# Patient Record
Sex: Female | Born: 1950
Health system: Southern US, Community
[De-identification: ages and names within clinical notes are randomized; demographics above are authoritative.]

## PROBLEM LIST (undated history)

## (undated) DIAGNOSIS — J45909 Unspecified asthma, uncomplicated: Secondary | ICD-10-CM

## (undated) DIAGNOSIS — R7303 Prediabetes: Secondary | ICD-10-CM

## (undated) DIAGNOSIS — E785 Hyperlipidemia, unspecified: Secondary | ICD-10-CM

## (undated) DIAGNOSIS — I10 Essential (primary) hypertension: Secondary | ICD-10-CM

## (undated) DIAGNOSIS — E119 Type 2 diabetes mellitus without complications: Secondary | ICD-10-CM

## (undated) HISTORY — DX: Hyperlipidemia, unspecified: E78.5

## (undated) HISTORY — DX: Type 2 diabetes mellitus without complications: E11.9

## (undated) HISTORY — PX: ABDOMINAL HYSTERECTOMY: SHX81

## (undated) HISTORY — DX: Prediabetes: R73.03

## (undated) HISTORY — DX: Essential (primary) hypertension: I10

---

## 1998-10-05 ENCOUNTER — Ambulatory Visit (HOSPITAL_COMMUNITY): Admission: RE | Admit: 1998-10-05 | Discharge: 1998-10-05 | Payer: Self-pay | Admitting: Gynecology

## 1998-10-05 ENCOUNTER — Encounter: Payer: Self-pay | Admitting: Gynecology

## 1998-10-08 ENCOUNTER — Encounter: Payer: Self-pay | Admitting: Gynecology

## 1998-10-08 ENCOUNTER — Ambulatory Visit: Admission: RE | Admit: 1998-10-08 | Discharge: 1998-10-08 | Payer: Self-pay | Admitting: Gynecology

## 1999-01-10 ENCOUNTER — Encounter: Admission: RE | Admit: 1999-01-10 | Discharge: 1999-01-10 | Payer: Self-pay | Admitting: Gynecology

## 1999-01-10 ENCOUNTER — Encounter: Payer: Self-pay | Admitting: Gynecology

## 1999-04-01 ENCOUNTER — Other Ambulatory Visit: Admission: RE | Admit: 1999-04-01 | Discharge: 1999-04-01 | Payer: Self-pay | Admitting: Gynecology

## 2000-01-16 ENCOUNTER — Encounter: Payer: Self-pay | Admitting: Gynecology

## 2000-01-16 ENCOUNTER — Encounter: Admission: RE | Admit: 2000-01-16 | Discharge: 2000-01-16 | Payer: Self-pay | Admitting: Gynecology

## 2000-02-03 ENCOUNTER — Other Ambulatory Visit: Admission: RE | Admit: 2000-02-03 | Discharge: 2000-02-03 | Payer: Self-pay | Admitting: Gynecology

## 2001-01-17 ENCOUNTER — Encounter: Admission: RE | Admit: 2001-01-17 | Discharge: 2001-01-17 | Payer: Self-pay | Admitting: Gynecology

## 2001-01-17 ENCOUNTER — Encounter: Payer: Self-pay | Admitting: Gynecology

## 2001-02-16 ENCOUNTER — Other Ambulatory Visit: Admission: RE | Admit: 2001-02-16 | Discharge: 2001-02-16 | Payer: Self-pay | Admitting: Gynecology

## 2002-01-23 ENCOUNTER — Encounter: Payer: Self-pay | Admitting: Gynecology

## 2002-01-23 ENCOUNTER — Encounter: Admission: RE | Admit: 2002-01-23 | Discharge: 2002-01-23 | Payer: Self-pay | Admitting: Gynecology

## 2002-04-20 ENCOUNTER — Other Ambulatory Visit: Admission: RE | Admit: 2002-04-20 | Discharge: 2002-04-20 | Payer: Self-pay | Admitting: Gynecology

## 2003-03-12 ENCOUNTER — Encounter: Admission: RE | Admit: 2003-03-12 | Discharge: 2003-03-12 | Payer: Self-pay | Admitting: Gynecology

## 2003-06-21 ENCOUNTER — Other Ambulatory Visit: Admission: RE | Admit: 2003-06-21 | Discharge: 2003-06-21 | Payer: Self-pay | Admitting: Gynecology

## 2004-03-23 ENCOUNTER — Emergency Department (HOSPITAL_COMMUNITY): Admission: EM | Admit: 2004-03-23 | Discharge: 2004-03-23 | Payer: Self-pay | Admitting: Family Medicine

## 2004-07-02 ENCOUNTER — Encounter: Admission: RE | Admit: 2004-07-02 | Discharge: 2004-07-02 | Payer: Self-pay | Admitting: Gynecology

## 2004-07-08 ENCOUNTER — Other Ambulatory Visit: Admission: RE | Admit: 2004-07-08 | Discharge: 2004-07-08 | Payer: Self-pay | Admitting: Gynecology

## 2005-01-25 ENCOUNTER — Emergency Department (HOSPITAL_COMMUNITY): Admission: EM | Admit: 2005-01-25 | Discharge: 2005-01-25 | Payer: Self-pay | Admitting: Family Medicine

## 2005-07-23 ENCOUNTER — Encounter: Admission: RE | Admit: 2005-07-23 | Discharge: 2005-07-23 | Payer: Self-pay | Admitting: Gynecology

## 2005-09-16 ENCOUNTER — Other Ambulatory Visit: Admission: RE | Admit: 2005-09-16 | Discharge: 2005-09-16 | Payer: Self-pay | Admitting: Gynecology

## 2006-08-02 ENCOUNTER — Encounter: Admission: RE | Admit: 2006-08-02 | Discharge: 2006-08-02 | Payer: Self-pay | Admitting: Gynecology

## 2006-08-04 ENCOUNTER — Encounter: Admission: RE | Admit: 2006-08-04 | Discharge: 2006-08-04 | Payer: Self-pay | Admitting: Gynecology

## 2006-10-04 ENCOUNTER — Other Ambulatory Visit: Admission: RE | Admit: 2006-10-04 | Discharge: 2006-10-04 | Payer: Self-pay | Admitting: Gynecology

## 2007-10-04 ENCOUNTER — Encounter: Admission: RE | Admit: 2007-10-04 | Discharge: 2007-10-04 | Payer: Self-pay | Admitting: Gynecology

## 2008-06-21 ENCOUNTER — Emergency Department (HOSPITAL_COMMUNITY): Admission: EM | Admit: 2008-06-21 | Discharge: 2008-06-21 | Payer: Self-pay | Admitting: Emergency Medicine

## 2008-08-29 ENCOUNTER — Emergency Department (HOSPITAL_COMMUNITY): Admission: EM | Admit: 2008-08-29 | Discharge: 2008-08-29 | Payer: Self-pay | Admitting: Family Medicine

## 2008-10-17 ENCOUNTER — Encounter: Admission: RE | Admit: 2008-10-17 | Discharge: 2008-10-17 | Payer: Self-pay | Admitting: Family Medicine

## 2009-05-27 ENCOUNTER — Emergency Department (HOSPITAL_COMMUNITY): Admission: EM | Admit: 2009-05-27 | Discharge: 2009-05-27 | Payer: Self-pay | Admitting: Emergency Medicine

## 2009-11-13 ENCOUNTER — Encounter: Admission: RE | Admit: 2009-11-13 | Discharge: 2009-11-13 | Payer: Self-pay | Admitting: Family Medicine

## 2010-02-09 ENCOUNTER — Encounter: Payer: Self-pay | Admitting: Gynecology

## 2010-11-10 ENCOUNTER — Other Ambulatory Visit: Payer: Self-pay | Admitting: Gynecology

## 2010-11-10 DIAGNOSIS — Z1231 Encounter for screening mammogram for malignant neoplasm of breast: Secondary | ICD-10-CM

## 2010-11-24 ENCOUNTER — Ambulatory Visit
Admission: RE | Admit: 2010-11-24 | Discharge: 2010-11-24 | Disposition: A | Discharge status: Home / Self Care | Payer: 59 | Source: Ambulatory Visit | Attending: Gynecology | Admitting: Gynecology

## 2010-11-24 ENCOUNTER — Ambulatory Visit: Payer: Self-pay

## 2010-11-24 DIAGNOSIS — Z1231 Encounter for screening mammogram for malignant neoplasm of breast: Secondary | ICD-10-CM

## 2010-12-01 ENCOUNTER — Other Ambulatory Visit: Payer: Self-pay | Admitting: Gynecology

## 2010-12-16 ENCOUNTER — Ambulatory Visit: Payer: Self-pay

## 2011-10-19 ENCOUNTER — Ambulatory Visit: Payer: 59 | Attending: Family Medicine

## 2011-10-19 DIAGNOSIS — R5381 Other malaise: Secondary | ICD-10-CM | POA: Insufficient documentation

## 2011-10-19 DIAGNOSIS — M25519 Pain in unspecified shoulder: Secondary | ICD-10-CM | POA: Insufficient documentation

## 2011-10-19 DIAGNOSIS — M25619 Stiffness of unspecified shoulder, not elsewhere classified: Secondary | ICD-10-CM | POA: Insufficient documentation

## 2011-10-19 DIAGNOSIS — IMO0001 Reserved for inherently not codable concepts without codable children: Secondary | ICD-10-CM | POA: Insufficient documentation

## 2011-10-21 ENCOUNTER — Ambulatory Visit: Payer: 59 | Attending: Family Medicine

## 2011-10-21 DIAGNOSIS — IMO0001 Reserved for inherently not codable concepts without codable children: Secondary | ICD-10-CM | POA: Insufficient documentation

## 2011-10-21 DIAGNOSIS — M25519 Pain in unspecified shoulder: Secondary | ICD-10-CM | POA: Insufficient documentation

## 2011-10-21 DIAGNOSIS — R5381 Other malaise: Secondary | ICD-10-CM | POA: Insufficient documentation

## 2011-10-21 DIAGNOSIS — M25619 Stiffness of unspecified shoulder, not elsewhere classified: Secondary | ICD-10-CM | POA: Insufficient documentation

## 2011-10-26 ENCOUNTER — Other Ambulatory Visit: Payer: Self-pay | Admitting: Gynecology

## 2011-10-26 DIAGNOSIS — Z1231 Encounter for screening mammogram for malignant neoplasm of breast: Secondary | ICD-10-CM

## 2011-10-28 ENCOUNTER — Ambulatory Visit: Payer: 59

## 2011-10-29 ENCOUNTER — Ambulatory Visit: Payer: 59

## 2011-11-05 ENCOUNTER — Ambulatory Visit: Payer: 59

## 2011-11-06 ENCOUNTER — Ambulatory Visit: Payer: 59

## 2011-11-10 ENCOUNTER — Ambulatory Visit: Payer: 59 | Admitting: Physical Therapy

## 2011-11-12 ENCOUNTER — Ambulatory Visit: Payer: 59 | Admitting: Physical Therapy

## 2011-11-25 ENCOUNTER — Ambulatory Visit
Admission: RE | Admit: 2011-11-25 | Discharge: 2011-11-25 | Disposition: A | Discharge status: Home / Self Care | Payer: 59 | Source: Ambulatory Visit | Attending: Gynecology | Admitting: Gynecology

## 2011-11-25 DIAGNOSIS — Z1231 Encounter for screening mammogram for malignant neoplasm of breast: Secondary | ICD-10-CM

## 2011-12-21 ENCOUNTER — Other Ambulatory Visit: Payer: Self-pay | Admitting: Gynecology

## 2012-02-24 ENCOUNTER — Emergency Department (HOSPITAL_COMMUNITY)
Admission: EM | Admit: 2012-02-24 | Discharge: 2012-02-24 | Disposition: A | Discharge status: Home / Self Care | Payer: 59 | Source: Home / Self Care | Attending: Emergency Medicine | Admitting: Emergency Medicine

## 2012-02-24 ENCOUNTER — Encounter (HOSPITAL_COMMUNITY): Payer: Self-pay | Admitting: Emergency Medicine

## 2012-02-24 DIAGNOSIS — J45909 Unspecified asthma, uncomplicated: Secondary | ICD-10-CM

## 2012-02-24 HISTORY — DX: Unspecified asthma, uncomplicated: J45.909

## 2012-02-24 MED ORDER — PREDNISONE 20 MG PO TABS: 40.0000 mg | ORAL_TABLET | Freq: Every day | ORAL | Status: AC

## 2012-02-24 MED ORDER — ALBUTEROL SULFATE HFA 108 (90 BASE) MCG/ACT IN AERS: 1.0000 | INHALATION_SPRAY | Freq: Four times a day (QID) | RESPIRATORY_TRACT | Status: DC | PRN

## 2012-02-24 MED ORDER — FEXOFENADINE-PSEUDOEPHED ER 60-120 MG PO TB12: 1.0000 | ORAL_TABLET | Freq: Two times a day (BID) | ORAL | Status: DC

## 2012-02-24 NOTE — ED Notes (Signed)
Pt c/o cold sx x6 days Sx include: cough w/yellow phlegm, wheezing, body aches Denies: SOB, f/v/n/d Took her albuterol today around 0600  She is alert w/no signs of acute resp distress.

## 2012-02-24 NOTE — ED Provider Notes (Signed)
History     CSN: [id number]  Arrival date & time 02/24/12  1018   First MD Initiated Contact with Patient 02/24/12 1121      Chief Complaint  Patient presents with  . URI    (Consider location/radiation/quality/duration/timing/severity/associated sxs/prior treatment) HPI Comments: Patient presents urgent care describing for about 5-6 days, patient has been using albuterol and she's been wheezing frequently mostly at night. She has been coughing some yellowish looking phlegm and having body aches and having some mild fatigue or tiredness. Patient denies feeling short of breath, have not had any tactile fevers that she can tell. No vomiting nausea diarrhea as her abdominal pain. Patient usually does not have to use her albuterol and have been using that for the last few days.  Patient is a 62 y.o. female presenting with URI. The history is provided by the patient.  URI The primary symptoms include cough and wheezing. Primary symptoms do not include fever, fatigue, headaches, ear pain, sore throat, swollen glands, abdominal pain, nausea, arthralgias or rash. The current episode started 6 to 7 days ago. This is a new problem. The problem has not changed since onset. The illness is not associated with chills, plugged ear sensation or rhinorrhea.    Past Medical History  Diagnosis Date  . Asthma     Past Surgical History  Procedure Date  . Abdominal hysterectomy     No family history on file.  History  Substance Use Topics  . Smoking status: Never Smoker   . Smokeless tobacco: Not on file  . Alcohol Use: Yes    OB History    Grav Para Term Preterm Abortions TAB SAB Ect Mult Living                  Review of Systems  Constitutional: Negative for fever, chills, diaphoresis, activity change, appetite change and fatigue.  HENT: Negative for ear pain, sore throat and rhinorrhea.   Respiratory: Positive for cough and wheezing.   Gastrointestinal: Negative for nausea and  abdominal pain.  Musculoskeletal: Negative for arthralgias.  Skin: Negative for rash and wound.  Neurological: Negative for dizziness and headaches.    Allergies  Review of patient's allergies indicates no known allergies.  Home Medications   Current Outpatient Rx  Name  Route  Sig  Dispense  Refill  . ALBUTEROL SULFATE HFA 108 (90 BASE) MCG/ACT IN AERS   Inhalation   Inhale 2 puffs into the lungs every 6 (six) hours as needed.         . ALPRAZOLAM 0.5 MG PO TABS   Oral   Take 0.5 mg by mouth at bedtime as needed.         Marland Kitchen VIVELLE-DOT TD   Transdermal   Place onto the skin.         . IBUPROFEN PO   Oral   Take by mouth.         . SOLIFENACIN SUCCINATE 5 MG PO TABS   Oral   Take 10 mg by mouth daily.         . ALBUTEROL SULFATE HFA 108 (90 BASE) MCG/ACT IN AERS   Inhalation   Inhale 1-2 puffs into the lungs every 6 (six) hours as needed for wheezing.   1 Inhaler   0   . FEXOFENADINE-PSEUDOEPHED ER 60-120 MG PO TB12   Oral   Take 1 tablet by mouth every 12 (twelve) hours.   14 tablet   0   . PREDNISONE 20  MG PO TABS   Oral   Take 2 tablets (40 mg total) by mouth daily. 2 tablets daily for 5 days   10 tablet   0     BP 153/88  Pulse 83  Temp 99.6 F (37.6 C) (Oral)  Resp 20  Ht 5\' 2"  (1.575 m)  Wt 185 lb (83.915 kg)  BMI 33.84 kg/m2  SpO2 96%  Physical Exam  Nursing note and vitals reviewed. Constitutional: She is oriented to person, place, and time. Vital signs are normal. She appears well-developed and well-nourished.  Non-toxic appearance. She does not have a sickly appearance. She does not appear ill. No distress.  HENT:  Head: Normocephalic.  Right Ear: Tympanic membrane normal.  Left Ear: Tympanic membrane normal.  Mouth/Throat: Uvula is midline and mucous membranes are normal. No oropharyngeal exudate, posterior oropharyngeal edema, posterior oropharyngeal erythema or tonsillar abscesses.  Eyes: Conjunctivae normal are normal.  Right eye exhibits no discharge. Left eye exhibits no discharge. No scleral icterus.  Neck: Neck supple. No JVD present. No tracheal deviation present. No thyromegaly present.  Cardiovascular: Normal rate, regular rhythm, normal heart sounds and normal pulses.   Pulmonary/Chest: Effort normal and breath sounds normal. She has no decreased breath sounds.  Abdominal: Soft.  Neurological: She is alert and oriented to person, place, and time.  Skin: No rash noted. No erythema.    ED Course  Procedures (including critical care time)  Labs Reviewed - No data to display No results found.   1. Asthma       MDM  Symptoms consistent with most likely upper respiratory infection- induces some reactive airway disease. Patient has been instructed to take Allegra-D along with a short course of 5 days of prednisone. Also instructed to use albuterol every 6-8 hours in the next 48 hours. Patient understands and agrees with treatment plan and followup care as necessary. We have discussed what symptoms should warrant further evaluation her throat. Patient expressed understanding.        Jimmie Molly, MD 02/24/12 [phone]

## 2012-12-09 ENCOUNTER — Other Ambulatory Visit: Payer: Self-pay

## 2012-12-09 DIAGNOSIS — Z1231 Encounter for screening mammogram for malignant neoplasm of breast: Secondary | ICD-10-CM

## 2013-01-13 ENCOUNTER — Ambulatory Visit: Payer: 59

## 2013-01-16 ENCOUNTER — Ambulatory Visit
Admission: RE | Admit: 2013-01-16 | Discharge: 2013-01-16 | Disposition: A | Discharge status: Home / Self Care | Payer: 59 | Source: Ambulatory Visit

## 2013-01-16 DIAGNOSIS — Z1231 Encounter for screening mammogram for malignant neoplasm of breast: Secondary | ICD-10-CM

## 2013-07-07 ENCOUNTER — Emergency Department (HOSPITAL_COMMUNITY)
Admission: EM | Admit: 2013-07-07 | Discharge: 2013-07-07 | Disposition: A | Discharge status: Home / Self Care | Payer: 59 | Source: Home / Self Care | Attending: Family Medicine | Admitting: Family Medicine

## 2013-07-07 ENCOUNTER — Encounter (HOSPITAL_COMMUNITY): Payer: Self-pay | Admitting: Emergency Medicine

## 2013-07-07 DIAGNOSIS — J40 Bronchitis, not specified as acute or chronic: Secondary | ICD-10-CM

## 2013-07-07 MED ORDER — IPRATROPIUM-ALBUTEROL 0.5-2.5 (3) MG/3ML IN SOLN
RESPIRATORY_TRACT | Status: AC
  Filled 2013-07-07: qty 3

## 2013-07-07 MED ORDER — ALBUTEROL SULFATE HFA 108 (90 BASE) MCG/ACT IN AERS: 2.0000 | INHALATION_SPRAY | Freq: Four times a day (QID) | RESPIRATORY_TRACT | Status: AC | PRN

## 2013-07-07 MED ORDER — IPRATROPIUM BROMIDE 0.06 % NA SOLN: 2.0000 | Freq: Four times a day (QID) | NASAL | Status: DC

## 2013-07-07 MED ORDER — IPRATROPIUM-ALBUTEROL 0.5-2.5 (3) MG/3ML IN SOLN
3.0000 mL | Freq: Once | RESPIRATORY_TRACT | Status: AC
  Administered 2013-07-07: 3 mL via RESPIRATORY_TRACT

## 2013-07-07 MED ORDER — GUAIFENESIN-CODEINE 100-10 MG/5ML PO SOLN: 5.0000 mL | Freq: Every evening | ORAL | Status: DC | PRN

## 2013-07-07 MED ORDER — PREDNISONE 10 MG PO TABS: 30.0000 mg | ORAL_TABLET | Freq: Every day | ORAL | Status: DC

## 2013-07-07 NOTE — ED Notes (Signed)
C/o   Productive cough.  Sore throat.  Since Tuesday.  Denies fever, n/v/d.   Mild relief with otc meds.

## 2013-07-07 NOTE — Discharge Instructions (Signed)
Thank you for coming in today. Use the albuterol and codeine cough medicine as needed.  Take the prednisone daily for 5 days.  Use the nasal spray as well.  Call or go to the emergency room if you get worse, have trouble breathing, have chest pains, or palpitations.    Bronchitis Bronchitis is inflammation of the airways that extend from the windpipe into the lungs (bronchi). The inflammation often causes mucus to develop, which leads to a cough. If the inflammation becomes severe, it may cause shortness of breath. CAUSES  Bronchitis may be caused by:   Viral infections.   Bacteria.   Cigarette smoke.   Allergens, pollutants, and other irritants.  SIGNS AND SYMPTOMS  The most common symptom of bronchitis is a frequent cough that produces mucus. Other symptoms include:  Fever.   Body aches.   Chest congestion.   Chills.   Shortness of breath.   Sore throat.  DIAGNOSIS  Bronchitis is usually diagnosed through a medical history and physical exam. Tests, such as chest X-rays, are sometimes done to rule out other conditions.  TREATMENT  You may need to avoid contact with whatever caused the problem (smoking, for example). Medicines are sometimes needed. These may include:  Antibiotics. These may be prescribed if the condition is caused by bacteria.  Cough suppressants. These may be prescribed for relief of cough symptoms.   Inhaled medicines. These may be prescribed to help open your airways and make it easier for you to breathe.   Steroid medicines. These may be prescribed for those with recurrent (chronic) bronchitis. HOME CARE INSTRUCTIONS  Get plenty of rest.   Drink enough fluids to keep your urine clear or pale yellow (unless you have a medical condition that requires fluid restriction). Increasing fluids may help thin your secretions and will prevent dehydration.   Only take over-the-counter or prescription medicines as directed by your health care  provider.  Only take antibiotics as directed. Make sure you finish them even if you start to feel better.  Avoid secondhand smoke, irritating chemicals, and strong fumes. These will make bronchitis worse. If you are a smoker, quit smoking. Consider using nicotine gum or skin patches to help control withdrawal symptoms. Quitting smoking will help your lungs heal faster.   Put a cool-mist humidifier in your bedroom at night to moisten the air. This may help loosen mucus. Change the water in the humidifier daily. You can also run the hot water in your shower and sit in the bathroom with the door closed for 5-10 minutes.   Follow up with your health care provider as directed.   Wash your hands frequently to avoid catching bronchitis again or spreading an infection to others.  SEEK MEDICAL CARE IF: Your symptoms do not improve after 1 week of treatment.  SEEK IMMEDIATE MEDICAL CARE IF:  Your fever increases.  You have chills.   You have chest pain.   You have worsening shortness of breath.   You have bloody sputum.  You faint.  You have lightheadedness.  You have a severe headache.   You vomit repeatedly. MAKE SURE YOU:   Understand these instructions.  Will watch your condition.  Will get help right away if you are not doing well or get worse. Document Released: 01/05/2005 Document Revised: 10/26/2012 Document Reviewed: 08/30/2012 [facility] Patient Information 2015 [place], [place]. This information is not intended to replace advice given to you by your health care provider. Make sure you discuss any questions you have with  your health care provider.

## 2013-07-07 NOTE — ED Provider Notes (Signed)
Vanessa Dyer is a 63 y.o. female who presents to Urgent Care today for cough. Patient has had mild productive cough sore throat and congestion present for 3 days. No shortness of breath or wheezing. She additionally notes a runny nose. She has a history of asthma and states that her symptoms are not consistent with typical asthma exacerbations. She has not used albuterol inhaler yet. She has tried Mucinex which has been mildly effective. She denies any fevers or chills nausea vomiting or diarrhea. She denies any chest pains or trouble breathing as well.   Past Medical History  Diagnosis Date  . Asthma    History  Substance Use Topics  . Smoking status: Never Smoker   . Smokeless tobacco: Not on file  . Alcohol Use: Yes   ROS as above Medications: No current facility-administered medications for this encounter.   Current Outpatient Prescriptions  Medication Sig Dispense Refill  . ALPRAZolam (XANAX) 0.5 MG tablet Take 0.5 mg by mouth at bedtime as needed.      . IBUPROFEN PO Take by mouth.      . solifenacin (VESICARE) 5 MG tablet Take 10 mg by mouth daily.      Marland Kitchen albuterol (PROVENTIL HFA;VENTOLIN HFA) 108 (90 BASE) MCG/ACT inhaler Inhale 2 puffs into the lungs every 6 (six) hours as needed for wheezing or shortness of breath.  1 Inhaler  2  . Estradiol (VIVELLE-DOT TD) Place onto the skin.      . fexofenadine-pseudoephedrine (ALLEGRA-D) 60-120 MG per tablet Take 1 tablet by mouth every 12 (twelve) hours.  14 tablet  0  . guaiFENesin-codeine 100-10 MG/5ML syrup Take 5 mLs by mouth at bedtime as needed for cough.  120 mL  0  . ipratropium (ATROVENT) 0.06 % nasal spray Place 2 sprays into both nostrils 4 (four) times daily.  15 mL  1  . predniSONE (DELTASONE) 10 MG tablet Take 3 tablets (30 mg total) by mouth daily.  15 tablet  0    Exam:  BP 133/85  Pulse 85  Temp(Src) 98.6 F (37 C) (Oral)  Resp 16  SpO2 95% Gen: Well NAD HEENT: EOMI,  MMM posterior pharynx with cobblestoning.  Tympanic membranes are normal appearing bilaterally. Lungs: Normal work of breathing. CTABL Heart: RRR no MRG Abd: NABS, Soft. NT, ND Exts: Brisk capillary refill, warm and well perfused.   Patient was given a DuoNeb nebulizer treatment, and improved  No results found for this or any previous visit (from the past 24 hour(s)). No results found.  Assessment and Plan: 63 y.o. female with viral bronchitis with possible asthma component. Plan to treat with prednisone albuterol Atrovent nasal spray and codeine containing cough medication.  Discussed warning signs or symptoms. Please see discharge instructions. Patient expresses understanding.    Gregor Hams, MD 07/07/13 [phone]

## 2013-07-09 ENCOUNTER — Emergency Department (HOSPITAL_COMMUNITY): Payer: 59

## 2013-07-09 ENCOUNTER — Encounter (HOSPITAL_COMMUNITY): Payer: Self-pay | Admitting: Emergency Medicine

## 2013-07-09 ENCOUNTER — Emergency Department (HOSPITAL_COMMUNITY)
Admission: EM | Admit: 2013-07-09 | Discharge: 2013-07-09 | Disposition: A | Discharge status: Home / Self Care | Payer: 59 | Attending: Emergency Medicine | Admitting: Emergency Medicine

## 2013-07-09 DIAGNOSIS — J45909 Unspecified asthma, uncomplicated: Secondary | ICD-10-CM | POA: Insufficient documentation

## 2013-07-09 DIAGNOSIS — Z79899 Other long term (current) drug therapy: Secondary | ICD-10-CM | POA: Insufficient documentation

## 2013-07-09 DIAGNOSIS — J40 Bronchitis, not specified as acute or chronic: Secondary | ICD-10-CM

## 2013-07-09 DIAGNOSIS — IMO0002 Reserved for concepts with insufficient information to code with codable children: Secondary | ICD-10-CM | POA: Insufficient documentation

## 2013-07-09 MED ORDER — HYDROCOD POLST-CHLORPHEN POLST 10-8 MG/5ML PO LQCR: 5.0000 mL | Freq: Two times a day (BID) | ORAL | Status: AC

## 2013-07-09 NOTE — ED Notes (Signed)
MD at bedside. 

## 2013-07-09 NOTE — ED Notes (Signed)
Pt presents to ed with c/o cough, chest congestion, sore throat x 1 week.

## 2013-07-09 NOTE — ED Provider Notes (Signed)
CSN: [id number]     Arrival date & time 07/09/13  0827 History   First MD Initiated Contact with Patient 07/09/13 [phone]     Chief Complaint  Patient presents with  . Cough  . Sore Throat      HPI  Patient presents with cough and sore throat. Symptoms for 5 days. Patient is a 48 hours ago at urgent care. Normal x-ray. Discharge with prednisone. She is on Mucinex. She has albuterol inhaler at home. She continues to complain of congestion in her upper chest. She coughs but cannot cough anything up. Dry cough without sputum production. No fevers no chills. No chest pain.  Past Medical History  Diagnosis Date  . Asthma    Past Surgical History  Procedure Laterality Date  . Abdominal hysterectomy     No family history on file. History  Substance Use Topics  . Smoking status: Never Smoker   . Smokeless tobacco: Not on file  . Alcohol Use: Yes   OB History   Grav Para Term Preterm Abortions TAB SAB Ect Mult Living                 Review of Systems  Constitutional: Negative for fever, chills, diaphoresis, appetite change and fatigue.  HENT: Negative for mouth sores, sore throat and trouble swallowing.   Eyes: Negative for visual disturbance.  Respiratory: Positive for cough. Negative for chest tightness, shortness of breath and wheezing.   Cardiovascular: Negative for chest pain.  Gastrointestinal: Negative for nausea, vomiting, abdominal pain, diarrhea and abdominal distention.  Endocrine: Negative for polydipsia, polyphagia and polyuria.  Genitourinary: Negative for dysuria, frequency and hematuria.  Musculoskeletal: Negative for gait problem.  Skin: Negative for color change, pallor and rash.  Neurological: Negative for dizziness, syncope, light-headedness and headaches.  Hematological: Does not bruise/bleed easily.  Psychiatric/Behavioral: Negative for behavioral problems and confusion.      Allergies  Review of patient's allergies indicates no known allergies.  Home  Medications   Prior to Admission medications   Medication Sig Start Date End Date Taking? Authorizing Provider  albuterol (PROVENTIL HFA;VENTOLIN HFA) 108 (90 BASE) MCG/ACT inhaler Inhale 2 puffs into the lungs every 6 (six) hours as needed for wheezing or shortness of breath. 07/07/13  Yes Gregor Hams, MD  ALPRAZolam Duanne Moron) 0.5 MG tablet Take 0.5 mg by mouth at bedtime as needed.   Yes Historical Provider, MD  estradiol (VIVELLE-DOT) 0.05 MG/24HR patch Place 1 patch onto the skin 2 (two) times a week. Sundays and Thursdays   Yes Historical Provider, MD  guaiFENesin-codeine 100-10 MG/5ML syrup Take 5 mLs by mouth at bedtime as needed for cough. 07/07/13  Yes Gregor Hams, MD  ipratropium (ATROVENT) 0.06 % nasal spray Place 2 sprays into both nostrils 4 (four) times daily. 07/07/13  Yes Gregor Hams, MD  predniSONE (DELTASONE) 10 MG tablet Take 3 tablets (30 mg total) by mouth daily. 07/07/13  Yes Gregor Hams, MD  solifenacin (VESICARE) 5 MG tablet Take 10 mg by mouth daily.   Yes Historical Provider, MD  chlorpheniramine-HYDROcodone (TUSSIONEX PENNKINETIC ER) 10-8 MG/5ML LQCR Take 5 mLs by mouth every 12 (twelve) hours. 07/09/13   Tanna Furry, MD   BP 147/85  Pulse 103  Temp(Src) 98.4 F (36.9 C) (Oral)  Resp 18  SpO2 97% Physical Exam  Constitutional: She is oriented to person, place, and time. She appears well-developed and well-nourished. No distress.  HENT:  Head: Normocephalic.  Eyes: Conjunctivae are normal. Pupils are equal,  round, and reactive to light. No scleral icterus.  Neck: Normal range of motion. Neck supple. No thyromegaly present.  Cardiovascular: Normal rate and regular rhythm.  Exam reveals no gallop and no friction rub.   No murmur heard. Pulmonary/Chest: Effort normal and breath sounds normal. No respiratory distress. She has no wheezes. She has no rales.  Clear bilateral breath sounds. No focal diminished breath sounds. No prolongation or wheezing. No crackles or signs  of consolidation clinically. Not tachypneic.  Abdominal: Soft. Bowel sounds are normal. She exhibits no distension. There is no tenderness. There is no rebound.  Musculoskeletal: Normal range of motion.  Neurological: She is alert and oriented to person, place, and time.  Skin: Skin is warm and dry. No rash noted.  Psychiatric: She has a normal mood and affect. Her behavior is normal.    ED Course  Procedures (including critical care time) Labs Review Labs Reviewed - No data to display  Imaging Review No results found.   EKG Interpretation None      MDM   Final diagnoses:  Bronchitis    X-ray is normal. Discussion she's had symptoms now for 5 days. And this is still very likely viral bronchitis. Yesterday continue her Mucinex, when necessary albuterol, and finish her course of prednisone. Not wheezing or bronchospastic now. No indication for antibiotics. Offered Tussionex for her cough. Otherwise expectant management for her bronchitis, likely viral mediated.    Tanna Furry, MD 07/09/13 [phone]

## 2013-07-09 NOTE — Discharge Instructions (Signed)
Bronchitis  Bronchitis is swelling (inflammation) of the air tubes leading to your lungs (bronchi). This causes mucus and a cough. If the swelling gets bad, you may have trouble breathing.  HOME CARE   · Rest.  · Drink enough fluids to keep your pee (urine) clear or pale yellow (unless you have a condition where you have to watch how much you drink).  · Only take medicine as told by your doctor. If you were given antibiotic medicines, finish them even if you start to feel better.  · Avoid smoke, irritating chemicals, and strong smells. These make the problem worse. Quit smoking if you smoke. This helps your lungs heal faster.  · Use a cool mist humidifier. Change the water in the humidifier every day. You can also sit in the bathroom with hot shower running for 5-10 minutes. Keep the door closed.  · See your health care provider as told.  · Wash your hands often.  GET HELP IF:  Your problems do not get better after 1 week.  GET HELP RIGHT AWAY IF:   · Your fever gets worse.  · You have chills.  · Your chest hurts.  · Your problems breathing get worse.  · You have blood in your mucus.  · You pass out (faint).  · You feel light-headed.  · You have a bad headache.  · You throw up (vomit) again and again.  MAKE SURE YOU:  · Understand these instructions.  · Will watch your condition.  · Will get help right away if you are not doing well or get worse.  Document Released: 06/24/2007 Document Revised: 01/10/2013 Document Reviewed: 08/30/2012  [employer]® Patient Information ©2015 [employer]. This information is not intended to replace advice given to you by your health care provider. Make sure you discuss any questions you have with your health care provider.

## 2013-12-26 ENCOUNTER — Other Ambulatory Visit: Payer: Self-pay

## 2013-12-26 DIAGNOSIS — Z1231 Encounter for screening mammogram for malignant neoplasm of breast: Secondary | ICD-10-CM

## 2014-01-17 ENCOUNTER — Ambulatory Visit: Payer: 59

## 2014-12-06 ENCOUNTER — Telehealth: Payer: Self-pay

## 2015-02-04 ENCOUNTER — Ambulatory Visit: Payer: 59 | Admitting: *Deleted

## 2015-02-07 ENCOUNTER — Other Ambulatory Visit: Payer: Self-pay

## 2015-02-07 VITALS — BP 140/88 | HR 70 | Resp 16 | Ht 62.0 in | Wt 188.6 lb

## 2015-02-07 DIAGNOSIS — I10 Essential (primary) hypertension: Secondary | ICD-10-CM

## 2015-02-07 NOTE — Patient Instructions (Signed)
1. Plan to attend required meeting with RD at the [facility] [place].  They will call you to schedule or call them at [phone] 2. Plan to pick up blood pressure monitor at the outpatient pharmacy 3. Plan to check blood pressure daily at different time and record on log 4. Plan to eat 30-45 GM (2-3) servings of carbohydrate a meal and 15 GM for snacks.  Plan to eat protein with your snacks 5. Plan to ride exercise bike for 10 minutes for 4 days a week.  Plan to increase time by 5 minutes as tolerated 6. Plan to complete EMMI programs by 04/01/15 7. Plan to return to [facility] in 2 months Call to Schedule

## 2015-02-07 NOTE — Patient Outreach (Signed)
[facility]) Care Management   02/07/2015  [place]RI BRUCKNER November 07, 1950 Vanessa Dyer  Vanessa Dyer is an 65 y.o. female. Member seen for initial office visit for Link to Wellness program for self management of hypertension   Subjective: Member staets that her blood pressures have been up and she would like to get it under control.  States she does not want to go on any medications.  States she is also prediabetic.  States she would like to lose weight.  States she has not been exercising any but she bought an exercise bike and wants to start.  States she does not add salt to her foods but she does like to eat CHO.  States she has an old blood pressure monitor but her provider wants her to get a new more reliable monitor.  States she has not gotten it yet.  Objective:   Review of Systems  All other systems reviewed and are negative.   Physical Exam  Today's Vitals   02/07/15 1320  BP: 140/88  Pulse: 70  Resp: 16  Height: 1.575 m (5\' 2" )  Weight: 188 lb 9.6 oz (85.548 kg)  SpO2: 98%  PainSc: 0-No pain    Current Medications:   Current Outpatient Prescriptions  Medication Sig Dispense Refill  . albuterol (PROVENTIL HFA;VENTOLIN HFA) 108 (90 BASE) MCG/ACT inhaler Inhale 2 puffs into the lungs every 6 (six) hours as needed for wheezing or shortness of breath. 1 Inhaler 2  . conjugated estrogens (PREMARIN) vaginal cream Place 1 Applicatorful vaginally 2 (two) times a week.    . estradiol (VIVELLE-DOT) 0.05 MG/24HR patch Place 1 patch onto the skin 2 (two) times a week. Sundays and Thursdays    . Multiple Vitamins-Minerals (MULTIVITAMIN GUMMIES ADULT PO) Take 1 tablet by mouth daily.    . solifenacin (VESICARE) 5 MG tablet Take 5 mg by mouth daily.     . valACYclovir (VALTREX) 500 MG tablet Take 500 mg by mouth 2 (two) times daily. Take 1/2 tablet twice a day for 5 days as needed    . Vitamin D, Cholecalciferol, 1000 units CAPS Take 1 capsule by mouth daily.    Marland Kitchen  ALPRAZolam (XANAX) 0.5 MG tablet Take 0.5 mg by mouth at bedtime as needed. Reported on 02/07/2015    . chlorpheniramine-HYDROcodone (TUSSIONEX PENNKINETIC ER) 10-8 MG/5ML LQCR Take 5 mLs by mouth every 12 (twelve) hours. (Patient not taking: Reported on 02/07/2015) 60 mL 0  . guaiFENesin-codeine 100-10 MG/5ML syrup Take 5 mLs by mouth at bedtime as needed for cough. (Patient not taking: Reported on 02/07/2015) 120 mL 0  . ipratropium (ATROVENT) 0.06 % nasal spray Place 2 sprays into both nostrils 4 (four) times daily. (Patient not taking: Reported on 02/07/2015) 15 mL 1  . predniSONE (DELTASONE) 10 MG tablet Take 3 tablets (30 mg total) by mouth daily. (Patient not taking: Reported on 02/07/2015) 15 tablet 0   No current facility-administered medications for this visit.    Functional Status:   In your present state of health, do you have any difficulty performing the following activities: 02/07/2015  Hearing? N  Vision? N  Difficulty concentrating or making decisions? N  Walking or climbing stairs? N  Dressing or bathing? N  Doing errands, shopping? N    Fall/Depression Screening:    PHQ 2/9 Scores 02/07/2015  PHQ - 2 Score 0    Assessment:  Member seen for initial office visit for Link to Wellness program for self management of Hypertension and impaired  fasting glucose.  Member has not been checking B/P at home as she had not purchased a new monitor yet.  Member does not do regular exercise but now has exercise equipment at home.  Reports following a low sodium diet but eating CHO.    Plan:  Plan to attend [facility]  They will call you to schedule or call them at [phone] Plan to pick up blood pressure monitor at the outpatient pharmacy Plan to check blood pressure daily at different time and record on log Plan to eat 30-45 GM (2-3) servings of carbohydrate a meal and 15 GM for snacks.  Plan to eat protein with your  snacks Plan to ride exercise bike for 10 minutes for 4 days a week.  Plan to increase time by 5 minutes as tolerated Plan to complete EMMI programs by 04/01/15 Plan to return to Link to Wellness in 2 months Call to Schedule  [facility] CM Care Plan Problem One        Most Recent Value   Care Plan Problem One  Elevated blood pressures   Role Documenting the Problem One  Care Management Coordinator   Care Plan for Problem One  Active   THN Long Term Goal (31-90 days)  Member will maintain blood pressure at 140/90 or below for 80 % of readings   THN Long Term Goal Start Date  02/07/15   Interventions for Problem One Long Term Goal  Given Link to Wellness teaching packet on hypertension and reviewed program requirements, Given voucher for low cost blood pressure monitor and instructed on how to pick up at pharmacy, Instructed on the dietary approaches to stopping  hypertendion diet, Instruced to check B/P dialy at different times  and to record on log, Instructed on importance of exercise  and weight loss to help control B/p    [facility] CM Care Plan Problem Two        Most Recent Value   Care Plan Problem Two  Knowledge deficit related to impaired fasting glocose   Role Documenting the Problem Two  Care Management Coordinator   Care Plan for Problem Two  Active   Interventions for Problem Two Long Term Goal   Given Link to Wellness diabetes teaching packet, Instructed on limiting portions sizes of CHO, Instructed on benefits of weight loss and exercise to improving her blood sugars   THN Long Term Goal (31-90) days  Member will verbalize understanding of prediabetes within the next 90 days   [facility] Long Term Goal Start Date  02/07/15    Peter Garter RN, [facility] Care Management Coordinator-Link to [place] Management [phone]

## 2015-02-08 DIAGNOSIS — R7301 Impaired fasting glucose: Secondary | ICD-10-CM | POA: Insufficient documentation

## 2015-02-22 MED FILL — MINIVELLE 0.05 MG PATCH: 0.05 | 28 days supply | Qty: 8 | Fill #5

## 2015-03-15 ENCOUNTER — Encounter: Payer: Self-pay | Admitting: Skilled Nursing Facility1

## 2015-03-15 ENCOUNTER — Encounter: Payer: 59 | Attending: Family Medicine | Admitting: Skilled Nursing Facility1

## 2015-03-15 VITALS — Ht 62.0 in | Wt 187.0 lb

## 2015-03-15 DIAGNOSIS — Z713 Dietary counseling and surveillance: Secondary | ICD-10-CM | POA: Insufficient documentation

## 2015-03-15 DIAGNOSIS — I10 Essential (primary) hypertension: Secondary | ICD-10-CM

## 2015-03-15 NOTE — Progress Notes (Signed)
  Medical Nutrition Therapy:  Appt start time: 0800 end time:  0845.   Assessment:  Primary concerns today: referred for hypertension. Pt states her doctor told her she has prediabetes or diabetes she does not really know, labs are able to be seen.  Pt states she sleeps well with 7 hours. Pt states her bowel movements are normal. Pt states she has pretty low energy.  Preferred Learning Style:   No preference indicated   Learning Readiness:   Contemplating  MEDICATIONS: See List   DIETARY INTAKE:  Usual eating pattern includes 3 meals and 2 snacks per day.  Everyday foods include none stated.  Avoided foods include none stated.    24-hr recall:  B ( AM):  mcdonalds-----omelet  Snk ( AM): cheese stick and crackers L ( PM): leftover omelet------salad----frozen meal---tuna fish Snk ( PM): peanut butter D ( PM): frozen meal with green beans Snk ( PM): none Beverages: coffee with cream and sugar, wine, sweet tea, water  *Meals outside the home: 2-3 times a week   Usual physical activity: ADL's  Estimated energy needs: 1600 calories 180 g carbohydrates 120 g protein 44 g fat  Progress Towards Goal(s):  In progress.   Nutritional Diagnosis:  NB-1.1 Food and nutrition-related knowledge deficit As related to no prior nutrition education from a nutrition professional.  As evidenced by pt report and 24 hr recall.    Intervention:  Nutrition counseling for hypertension. Dietitian educated the pt on balanced meals, meal skipping, and the importance of physical activity. Goals: -Try to be physically active often throughout the week; walking the stairs the at work  At least 3 days a week -Be aware of your grocery list: accommodate 3 meals and some snacks a day -A snack: carbohydrate OR vegetable AND Protein -A meal: carbohydrate, protein, vegetable -Drink more water: in your water you can put cucumber, cinnamon stick, frozen fruit, flavoring powders, etc.   Teaching Method  Utilized:  Visual Auditory  Barriers to learning/adherence to lifestyle change: none identified  Demonstrated degree of understanding via:  Teach Back   Monitoring/Evaluation:  Dietary intake, exercise, and body weight prn.

## 2015-03-15 NOTE — Patient Instructions (Signed)
-  Try to be physically active often throughout the week; walking the stairs the at work  At least 3 days a week -Be aware of your grocery list: accommodate 3 meals and some snacks a day -A snack: carbohydrate OR vegetable AND Protein -A meal: carbohydrate, protein, vegetable -Drink more water: in your water you can put cucumber, cinnamon stick, frozen fruit, flavoring powders, etc.

## 2015-03-26 MED FILL — VENTOLIN HFA 90 MCG INHALER: 108 (90 BAS | 90 days supply | Qty: 18 | Fill #0

## 2015-04-02 MED FILL — ALPRAZolam 0.5 MG TABS: 0.5 | 30 days supply | Qty: 30 | Fill #0

## 2015-04-10 DIAGNOSIS — R7301 Impaired fasting glucose: Secondary | ICD-10-CM | POA: Diagnosis not present

## 2015-04-15 ENCOUNTER — Ambulatory Visit: Payer: Self-pay

## 2015-04-16 MED FILL — CONTOUR NEXT STRIPS: 90 days supply | Qty: 100 | Fill #0

## 2015-04-16 MED FILL — MICROLET LANCETS: 90 days supply | Qty: 100 | Fill #0

## 2015-04-16 MED FILL — CONTOUR NEXT EZ METER: W/DEVICE | 20 days supply | Qty: 1 | Fill #0

## 2015-05-01 DIAGNOSIS — Z1389 Encounter for screening for other disorder: Secondary | ICD-10-CM | POA: Diagnosis not present

## 2015-05-01 DIAGNOSIS — Z6835 Body mass index (BMI) 35.0-35.9, adult: Secondary | ICD-10-CM | POA: Diagnosis not present

## 2015-05-01 DIAGNOSIS — Z130 Encounter for screening for diseases of the blood and blood-forming organs and certain disorders involving the immune mechanism: Secondary | ICD-10-CM | POA: Diagnosis not present

## 2015-05-01 DIAGNOSIS — Z1231 Encounter for screening mammogram for malignant neoplasm of breast: Secondary | ICD-10-CM | POA: Diagnosis not present

## 2015-05-01 DIAGNOSIS — Z01419 Encounter for gynecological examination (general) (routine) without abnormal findings: Secondary | ICD-10-CM | POA: Diagnosis not present

## 2015-05-01 DIAGNOSIS — N951 Menopausal and female climacteric states: Secondary | ICD-10-CM | POA: Diagnosis not present

## 2015-05-01 DIAGNOSIS — N3946 Mixed incontinence: Secondary | ICD-10-CM | POA: Diagnosis not present

## 2015-05-01 DIAGNOSIS — Z79890 Hormone replacement therapy (postmenopausal): Secondary | ICD-10-CM | POA: Diagnosis not present

## 2015-05-01 MED FILL — MINIVELLE 0.05 MG PATCH: 0.05 | 55 days supply | Qty: 16 | Fill #0

## 2015-05-03 ENCOUNTER — Encounter: Payer: Self-pay | Admitting: Skilled Nursing Facility1

## 2015-05-03 ENCOUNTER — Encounter: Payer: 59 | Attending: Family Medicine | Admitting: Skilled Nursing Facility1

## 2015-05-03 VITALS — Ht 62.0 in | Wt 185.0 lb

## 2015-05-03 DIAGNOSIS — Z713 Dietary counseling and surveillance: Secondary | ICD-10-CM | POA: Insufficient documentation

## 2015-05-03 DIAGNOSIS — E119 Type 2 diabetes mellitus without complications: Secondary | ICD-10-CM

## 2015-05-03 DIAGNOSIS — I10 Essential (primary) hypertension: Secondary | ICD-10-CM | POA: Diagnosis not present

## 2015-05-03 NOTE — Progress Notes (Signed)
  Medical Nutrition Therapy:  Appt start time: 0800 end time:  0845.   Assessment:  Primary concerns today: referred for hypertension.Pt states her A1C is at 7%. Pt states she Thinks about what she eats before she eats it now. Pt states she has been watching her portions stopped putting sugar in her coffee. Pt staets her fasting blood sugars have been around 120 (lowest 90 highest 130). Pt has lost 2 pounds since her last visit and states her blood pressure is within normal limits.  Preferred Learning Style:   No preference indicated   Learning Readiness:   Contemplating  MEDICATIONS: See List   DIETARY INTAKE:  Usual eating pattern includes 3 meals and 2 snacks per day.  Everyday foods include none stated.  Avoided foods include none stated.    24-hr recall:  B ( AM):  mcdonalds-----omelet----salmon patties Snk ( AM): cheese stick and crackers L ( PM): leftover omelet------salad----frozen meal---tuna fish Snk ( PM): peanut butter D ( PM): frozen meal with green beans Snk ( PM): none Beverages: coffee with cream, wine, sweet tea, water  *Meals outside the home: 2-3 times a week   Usual physical activity: walking at work and stairs at work  Estimated energy needs: 1600 calories 180 g carbohydrates 120 g protein 44 g fat  Progress Towards Goal(s):  In progress.   Nutritional Diagnosis:  NB-1.1 Food and nutrition-related knowledge deficit As related to no prior nutrition education from a nutrition professional.  As evidenced by pt report and 24 hr recall.    Intervention:  Nutrition counseling for diabetes. Dietitian educated the pt on carbohydrate counting and congradualted her on her changes. Goals: -Try to be physically active often throughout the week; walking the stairs the at work  At least 3 days a week -Be aware of your grocery list: accommodate 3 meals and some snacks a day -A snack: carbohydrate OR vegetable AND Protein -A meal: carbohydrate, protein,  vegetable -Drink more water: in your water you can put cucumber, cinnamon stick, frozen fruit, flavoring powders, etc.   Teaching Method Utilized:  Visual Auditory  Barriers to learning/adherence to lifestyle change: none identified  Demonstrated degree of understanding via:  Teach Back   Monitoring/Evaluation:  Dietary intake, A1C, exercise, and body weight prn.

## 2015-05-17 ENCOUNTER — Ambulatory Visit: Payer: Self-pay

## 2015-05-17 ENCOUNTER — Other Ambulatory Visit: Payer: Self-pay

## 2015-05-17 VITALS — BP 128/88 | HR 70 | Resp 16 | Ht 62.0 in | Wt 187.8 lb

## 2015-05-17 DIAGNOSIS — I10 Essential (primary) hypertension: Secondary | ICD-10-CM

## 2015-05-17 DIAGNOSIS — R7301 Impaired fasting glucose: Secondary | ICD-10-CM

## 2015-05-17 NOTE — Patient Outreach (Signed)
[facility]) Care Management   05/17/2015  Vanessa Dyer 1950-04-19 [id number]  Vanessa Dyer is an 65 y.o. female.   Member seen for follow up office visit for Link to Wellness program for self management of Type 2 diabetes and hypertension  Subjective: Member states that her hemoglobin A1C has gone up to 7%.  States she told her provider she would like to try to lower it by diet and exercise.  States she saw the RD and she has been watching her CHO portions and she is avoiding rice and pasta as much as she can.  States she is checking her blood sugars before breakfast daily and they range form 90-137.  States she is checking her blood pressures daily and they have been good.  States she is following a low sodium diet.  States she has not been exercising regularly but she is taking the stairs at work and counting her steps with her phone.  Objective:   Review of Systems  All other systems reviewed and are negative.   Physical Exam Today's Vitals   05/17/15 1007  BP: 128/88  Pulse: 70  Resp: 16  Height: 1.575 m (_0 )  Weight: 187 lb 12.8 oz (85.186 kg)  SpO2: 97%  PainSc: 0-No pain   Encounter Medications:   Outpatient Encounter Prescriptions as of 05/17/2015  Medication Sig Note  . albuterol (PROVENTIL HFA;VENTOLIN HFA) 108 (90 BASE) MCG/ACT inhaler Inhale 2 puffs into the lungs every 6 (six) hours as needed for wheezing or shortness of breath.   . ALPRAZolam (XANAX) 0.5 MG tablet Take 0.5 mg by mouth at bedtime as needed. Reported on 02/07/2015   . conjugated estrogens (PREMARIN) vaginal cream Place 1 Applicatorful vaginally 2 (two) times a week.   . estradiol (VIVELLE-DOT) 0.05 MG/24HR patch Place 1 patch onto the skin 2 (two) times a week. Sundays and Thursdays 07/09/2013: Check strength   . ipratropium (ATROVENT) 0.06 % nasal spray Place 2 sprays into both nostrils 4 (four) times daily.   . Multiple Vitamins-Minerals (MULTIVITAMIN GUMMIES ADULT PO) Take  1 tablet by mouth daily.   . valACYclovir (VALTREX) 500 MG tablet Take 500 mg by mouth 2 (two) times daily. Take 1/2 tablet twice a day for 5 days as needed   . Vitamin D, Cholecalciferol, 1000 units CAPS Take 2 capsules by mouth daily.    . chlorpheniramine-HYDROcodone (TUSSIONEX PENNKINETIC ER) 10-8 MG/5ML LQCR Take 5 mLs by mouth every 12 (twelve) hours. (Patient not taking: Reported on 02/07/2015)   . guaiFENesin-codeine 100-10 MG/5ML syrup Take 5 mLs by mouth at bedtime as needed for cough. (Patient not taking: Reported on 02/07/2015)   . predniSONE (DELTASONE) 10 MG tablet Take 3 tablets (30 mg total) by mouth daily. (Patient not taking: Reported on 02/07/2015)   . solifenacin (VESICARE) 5 MG tablet Take 5 mg by mouth daily. Reported on 05/17/2015    No facility-administered encounter medications on file as of 05/17/2015.    Functional Status:   In your present state of health, do you have any difficulty performing the following activities: 02/07/2015  Hearing? N  Vision? N  Difficulty concentrating or making decisions? N  Walking or climbing stairs? N  Dressing or bathing? N  Doing errands, shopping? N    Fall/Depression Screening:    PHQ 2/9 Scores 05/17/2015 05/03/2015 03/15/2015 02/07/2015  PHQ - 2 Score 0 0 0 0    Assessment:  Member seen for follow up office visit for Link to [employer]  program for self management of Hypertension and new dx of Type 2 DM.Member with hemoglobin A1C increased to 7 at 04/10/15 visit.  Member does not want to start medication as she wants to try to lower with lifestyle changes, Member checking B/P at home daily with only 2 reading over 140/90.   Member not doing regular exercise  Reports following a lower CHO,  low sodium diet.  Member did visit with RD for education on diet.  Plan:   Plan to check blood sugars daily fasting or 1  -2 hours after meals.  Goals 80-130 fasting and 140-180 after meals Plan to check blood pressure 2-3 times a week at different  time and record on log Plan to eat 30-45 GM (2-3) servings of carbohydrate a meal and 15 GM for snacks.  Plan to eat protein with your snacks Plan to walk 20-30 minutes for 4 days a week.  Goal of 150 minutes a week Plan to complete EMMI programs by 06/20/15  Plan to return to Link to Wellness on 07/10/15 at [place] Problem One        Most Recent Value   Care Plan Problem One  Elevated blood pressures   Role Documenting the Problem One  Care Management [place] for Problem One  Active   THN Long Term Goal (31-90 days)  Member will maintain blood pressure at 140/90 or below for 80 % of readings   THN Long Term Goal Start Date  02/07/15   [facility] Long Term Goal Met Date  05/17/15 [Achieved goal ]   Interventions for Problem One Long Term Goal  Reinforced instructions on the dietary approaches to stopping  hypertendion diet, Reviewed B/P log,  Reinforced to check B/P 2-3 times a week at different times  and to record on log, Reinforced on importance of exercise  and weight loss to help control B/p    [facility] CM Care Plan Problem Two        Most Recent Value   Care Plan Problem Two  Knowledge deficit related to new dx of Type 2 DM   Role Documenting the Problem Two  Care Management Coordinator   Care Plan for Problem Two  Active   Interventions for Problem Two Long Term Goal   Instructed on type 2 DM and how to lower her blood sugars and hemoglobin A1C, Instructed on blood sugar goals, Reinforced instructions to  limit portions sizes of CHO, Reinforced benefits of weight loss and exercise to improving her blood sugars   THN Long Term Goal (31-90) days  Member will verbalize understanding of diabetes and lower hemoglobin A1C to below 7% within the next 90 days   [facility]N Long Term Goal Start Date  05/17/15 [continued hemoglobin A1C 7]    Peter Garter RN, [facility] Care Management Coordinator-Link to [place] Management [phone]

## 2015-05-17 NOTE — Patient Instructions (Signed)
1. Plan to check blood sugars daily fasting or 1  -2 hours after meals.  Goals 80-130 fasting and 140-180 after meals 2. Plan to check blood pressure 2-3 times a week at different time and record on log 3. Plan to eat 30-45 GM (2-3) servings of carbohydrate a meal and 15 GM for snacks.  Plan to eat protein with your snacks 4. Plan to walk 20-30 minutes for 4 days a week.  Goal of 150 minutes a week 5. Plan to complete EMMI programs by 06/20/15 6. Plan to return to Link to Wellness on 07/10/15 at 9:30AM

## 2015-05-24 IMAGING — CR DG CHEST 2V
2 series · 2 of 2 positions shown · non-contrast
Comparison: None.

CLINICAL DATA: cough

EXAM:
CHEST  2 VIEW

[w chest pa]
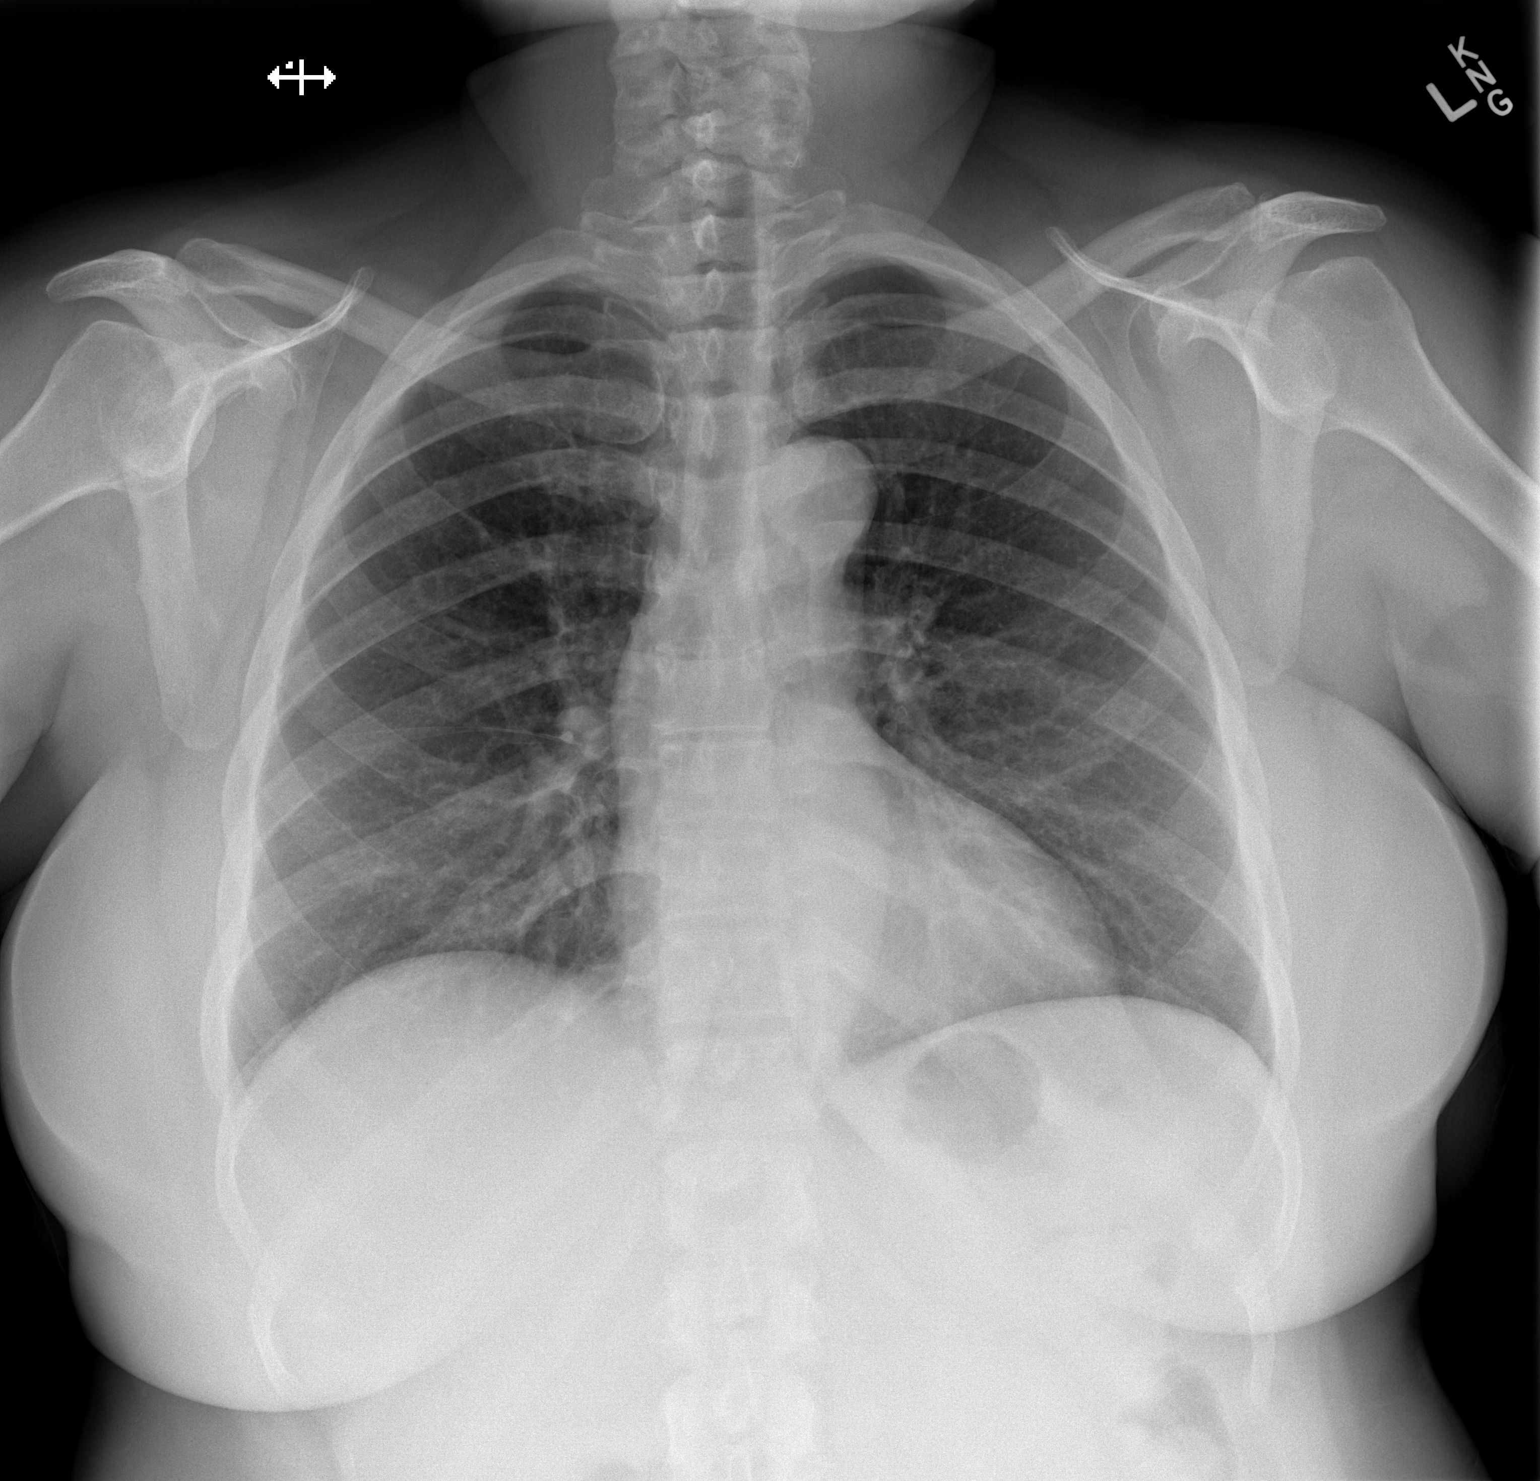

[w chest lat]
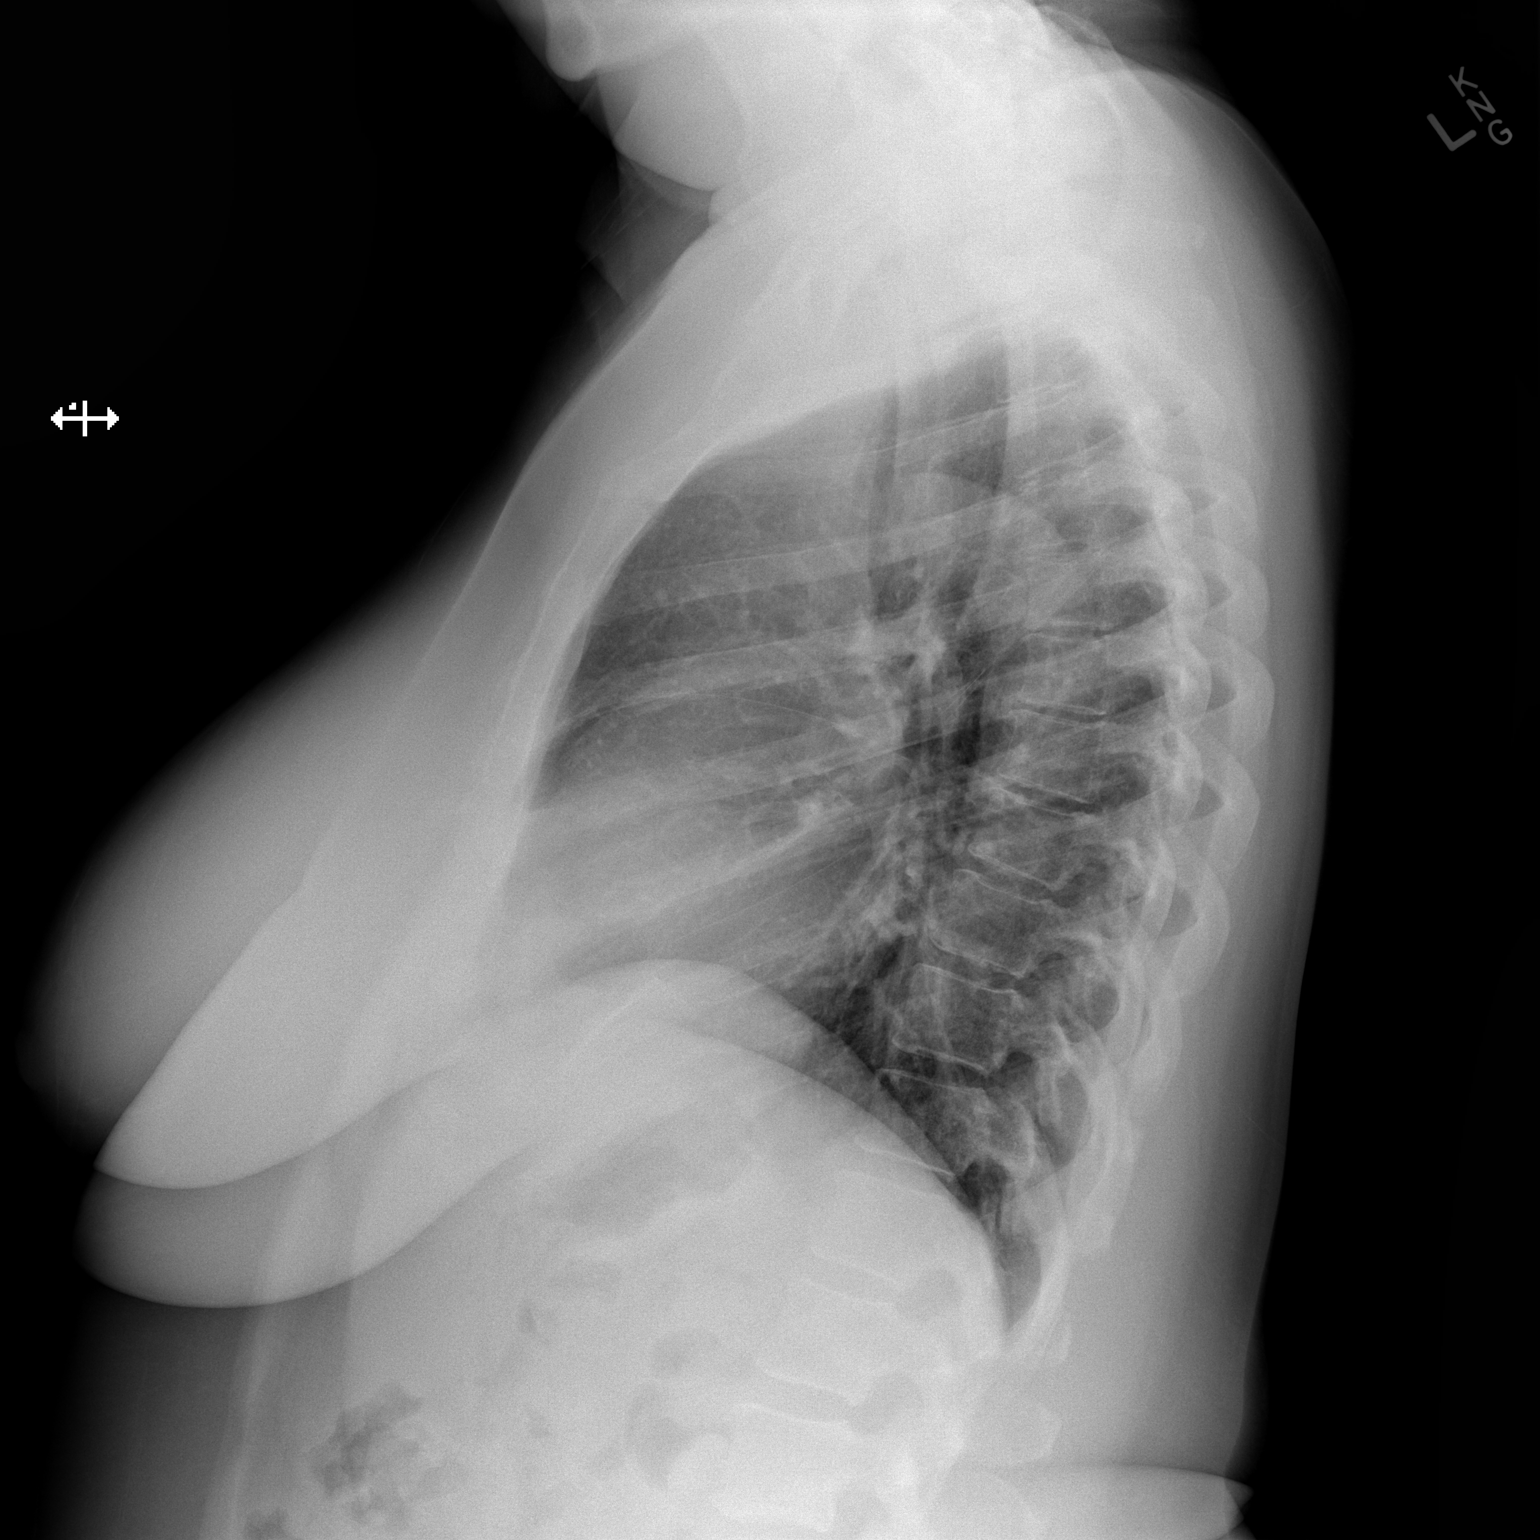

[2 of 2 positions shown; findings below may reference images not displayed]

FINDINGS: The heart size and mediastinal contours are within normal limits.
Both lungs are clear. The visualized skeletal structures are
unremarkable.
IMPRESSION: No active cardiopulmonary disease.

## 2015-06-26 MED FILL — PREMARIN VAGINAL CREAM-APPL: 0.625 | 90 days supply | Qty: 30 | Fill #1

## 2015-06-27 MED FILL — MINIVELLE 0.05 MG PATCH: 0.05 | 27 days supply | Qty: 8 | Fill #1

## 2015-07-10 ENCOUNTER — Ambulatory Visit: Payer: 59

## 2015-07-15 ENCOUNTER — Other Ambulatory Visit: Payer: Self-pay

## 2015-07-15 VITALS — BP 144/86 | HR 62 | Resp 16 | Ht 62.0 in | Wt 183.2 lb

## 2015-07-15 DIAGNOSIS — R7301 Impaired fasting glucose: Secondary | ICD-10-CM

## 2015-07-15 DIAGNOSIS — I10 Essential (primary) hypertension: Secondary | ICD-10-CM

## 2015-07-15 LAB — POCT GLYCOSYLATED HEMOGLOBIN (HGB A1C): Hemoglobin A1C: 6.5

## 2015-07-15 NOTE — Patient Outreach (Signed)
[facility]) Care Management   07/15/2015  Vanessa Dyer [birth year] [id number]  Vanessa Dyer is an 65 y.o. female.   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  States that her brother lives with her parents but she feels he is not doing everything that he could do to care for them.  States that she is watching her CHO portions.  States that she is walking at work and her goal on her [place] is 12,000 steps.  States that she is checking her blood sugars 3-4 times a week and they are ranging 92-130 before breakfast.  States her B/P readings have been good except they go up when she goes to visit her parents and her high readings are taken during this time.  States she is planning on taking some leave time to care for them and to get things in order at their house.  Objective:   Review of Systems  All other systems reviewed and are negative.   Physical Exam Today's Vitals   07/15/15 0959  BP: 144/86  Pulse: 62  Resp: 16  Height: 1.575 m (5\' 2" )  Weight: 183 lb 3.2 oz (83.099 kg)  SpO2: 99%  PainSc: 0-No pain   Encounter Medications:   Outpatient Encounter Prescriptions as of 07/15/2015  Medication Sig Note  . albuterol (PROVENTIL HFA;VENTOLIN HFA) 108 (90 BASE) MCG/ACT inhaler Inhale 2 puffs into the lungs every 6 (six) hours as needed for wheezing or shortness of breath.   . ALPRAZolam (XANAX) 0.5 MG tablet Take 0.5 mg by mouth at bedtime as needed. Reported on 02/07/2015   . conjugated estrogens (PREMARIN) vaginal cream Place 1 Applicatorful vaginally 2 (two) times a week.   . estradiol (VIVELLE-DOT) 0.05 MG/24HR patch Place 1 patch onto the skin 2 (two) times a week. Sundays and Thursdays 07/09/2013: Check strength   . Multiple Vitamins-Minerals (MULTIVITAMIN GUMMIES ADULT PO)  Take 1 tablet by mouth daily.   . valACYclovir (VALTREX) 500 MG tablet Take 500 mg by mouth 2 (two) times daily. Take 1/2 tablet twice a day for 5 days as needed   . Vitamin D, Cholecalciferol, 1000 units CAPS Take 2 capsules by mouth daily.    . chlorpheniramine-HYDROcodone (TUSSIONEX PENNKINETIC ER) 10-8 MG/5ML LQCR Take 5 mLs by mouth every 12 (twelve) hours. (Patient not taking: Reported on 02/07/2015)   . guaiFENesin-codeine 100-10 MG/5ML syrup Take 5 mLs by mouth at bedtime as needed for cough. (Patient not taking: Reported on 02/07/2015)   . ipratropium (ATROVENT) 0.06 % nasal spray Place 2 sprays into both nostrils 4 (four) times daily. (Patient not taking: Reported on 07/15/2015)   . predniSONE (DELTASONE) 10 MG tablet Take 3 tablets (30 mg total) by mouth daily. (Patient not taking: Reported on 02/07/2015)   . solifenacin (VESICARE) 5 MG tablet Take 5 mg by mouth daily. Reported on 07/15/2015    No facility-administered encounter medications on file as of 07/15/2015.    Functional Status:   In your present state of health, do you have any difficulty performing the following activities: 07/15/2015 02/07/2015  Hearing? N N  Vision? N N  Difficulty concentrating or making decisions? N N  Walking or climbing stairs? N N  Dressing or bathing? N N  Doing errands, shopping? N N    Fall/Depression Screening:  PHQ 2/9 Scores 07/15/2015 05/17/2015 05/03/2015 03/15/2015 02/07/2015  PHQ - 2 Score 0 0 0 0 0    Assessment:  Member seen for follow up office visit for Link to Wellness program for self management of Hypertension and new dx of Type 2 DM. Member with hemoglobin A1C of 6.5% at today's visit which is improved from 7%.   Member does not want to start medication as she wants to try to lower with lifestyle changes.   Member checking B/P at home 2-3 times a week highest reading 154/94 and lowest 108/74.  Reports increased stress with caregiving and family issues.  Member now walking regularly  for exercise and wearing an activity tracker. Reports following a lower CHO, low sodium diet.   Plan:   Plan to check blood sugars 1-2 times a week fasting or 1  -2 hours after meals.  Goals 80-110 fasting and 140-180 after meals Plan to check blood pressure 2-3 times a week at different time and record on log Plan to try doing relaxation exercises and consider going to yoga classes at [facility] to eat 30-45 GM (2-3) servings of carbohydrate a meal and 15 GM for snacks.  Plan to eat protein with your snacks Plan to walk 20-30 minutes for 4 days a week.  Goal of 150 minutes a week Plan to see provider on 10/01/15 Plan to return to Link to Wellness on 10/28/15 at [place] Problem One        Most Recent Value   Care Plan Problem One  Elevated blood pressures   Role Documenting the Problem One  Care Management Coordinator   Care Plan for Problem One  Active   THN Long Term Goal (31-90 days)  Member will maintain blood pressure at 140/90 or below for 80 % of readings   THN Long Term Goal Start Date  07/15/15 [Continue B/P elevated with stress]   Interventions for Problem One Long Term Goal  Discussed stress reduction methods and encouraged to use relaxation phone applications, Instructed  to discuss with her brother and sisters about their duties in caring for their parents,Reinforced instructions on the dietary approaches to stopping  hypertendion diet, Reviewed B/P log,  Reinforced to check B/P 2-3 times a week at different times  and to record on log, Reinforced on importance of exercise  and weight loss to help control B/p    [facility] CM Care Plan Problem Two        Most Recent Value   Care Plan Problem Two  Knowledge deficit related to new dx of Type 2 DM   Role Documenting the Problem Two  Care Management Coordinator   Care Plan for Problem Two  Active   Interventions for Problem Two Long Term Goal   Reinforced instructions on type 2 DM and how to lower her blood sugars  and hemoglobin A1C, Reviewed blood sugar goals and how they relate to her hemoglobin A1C, Praised for increasing activity and encouraged to continue to exercise, Reinforced instructions to  limit portions sizes of CHO, Reinforced benefits of weight loss and exercise to improving her blood sugars   THN Long Term Goal (31-90) days  Member will verbalize understanding of diabetes and lower hemoglobin A1C to below 7% within the next 90 days   THN Long Term Goal Start Date  07/15/15 [Continued hemoglobin A1C 6.5% today]    Peter Garter RN, [facility] Care Management Coordinator-Link to [place] Management [phone]

## 2015-07-15 NOTE — Patient Instructions (Signed)
1. Plan to check blood sugars 1-2 times a week fasting or 1  -2 hours after meals.  Goals 80-110 fasting and 140-180 after meals 2. Plan to check blood pressure 2-3 times a week at different time and record on log 3. Plan to try doing relaxation exercises and consider going to yoga classes at [facility] 4. Plan to eat 30-45 GM (2-3) servings of carbohydrate a meal and 15 GM for snacks.  Plan to eat protein with your snacks 5. Plan to walk 20-30 minutes for 4 days a week.  Goal of 150 minutes a week 6. Plan to see provider on 10/01/15 Plan to return to Link to Wellness on 10/28/15 at [facility]

## 2015-08-01 MED FILL — MICROLET LANCETS: 90 days supply | Qty: 100 | Fill #1

## 2015-08-23 MED FILL — MINIVELLE 0.05 MG PATCH: 0.05 | 27 days supply | Qty: 8 | Fill #2

## 2015-10-09 DIAGNOSIS — Z23 Encounter for immunization: Secondary | ICD-10-CM | POA: Diagnosis not present

## 2015-10-09 DIAGNOSIS — E119 Type 2 diabetes mellitus without complications: Secondary | ICD-10-CM | POA: Diagnosis not present

## 2015-10-09 DIAGNOSIS — Z0000 Encounter for general adult medical examination without abnormal findings: Secondary | ICD-10-CM | POA: Diagnosis not present

## 2015-10-09 DIAGNOSIS — J4520 Mild intermittent asthma, uncomplicated: Secondary | ICD-10-CM | POA: Diagnosis not present

## 2015-10-09 MED FILL — LISINOPRIL 5 MG TABLET: 5 | 90 days supply | Qty: 90 | Fill #0

## 2015-10-21 MED FILL — ATORVASTATIN 10 MG TABLET: 10 | 30 days supply | Qty: 30 | Fill #0

## 2015-10-21 MED FILL — RAMIPRIL 2.5 MG CAPSULE: 2.5 | 30 days supply | Qty: 30 | Fill #0

## 2015-10-22 MED FILL — MICROLET LANCETS: 90 days supply | Qty: 100 | Fill #2

## 2015-10-22 MED FILL — MINIVELLE 0.05 MG PATCH: 0.05 | 27 days supply | Qty: 8 | Fill #3

## 2015-10-28 ENCOUNTER — Ambulatory Visit: Payer: Self-pay

## 2015-11-14 DIAGNOSIS — Z0100 Encounter for examination of eyes and vision without abnormal findings: Secondary | ICD-10-CM | POA: Diagnosis not present

## 2015-11-26 MED FILL — RAMIPRIL 2.5 MG CAPSULE: 2.5 | 30 days supply | Qty: 30 | Fill #1

## 2015-11-28 ENCOUNTER — Other Ambulatory Visit: Payer: Self-pay

## 2015-11-28 DIAGNOSIS — E119 Type 2 diabetes mellitus without complications: Secondary | ICD-10-CM | POA: Insufficient documentation

## 2015-11-28 DIAGNOSIS — I10 Essential (primary) hypertension: Secondary | ICD-10-CM

## 2015-11-28 NOTE — Patient Outreach (Signed)
[facility]) Care Management   11/28/2015  Vanessa Dyer 04-Jan-1951 [id number]  Vanessa Dyer is an 65 y.o. female.   Member seen for follow up office visit for Link to Wellness program for self management of Type 2 diabetes and hypertension  Subjective: member states that her hemoglobin A1C was down to 6.3% when she saw her provider in September.  States that her cholesterol was up some and she started on Lipitor.  States she also started on lisinopril but it made her cough.  States her MD changed her to ramipril and she is tolerating the medication well.  States her blood pressures have improved since starting the medication.  States she is now going to a trainer twice a week for 30 minutes and she is getting over 10,000 steps most days at work along with walking on her breaks a work.  States she is watching what she eats and is still trying to lose weight.  States her stress has improved as she has arranged for someone to assist her Mother with bathing several times a week.    Objective:   Review of Systems  All other systems reviewed and are negative.   Physical Exam Today's Vitals   11/28/15 1015 11/28/15 1027  BP: 122/88   Pulse: 65   Resp: 16   SpO2: 97%   Weight: 177 lb (80.3 kg)   Height: 1.575 m (5\' 2" )   PainSc: 0-No pain 0-No pain   Encounter Medications:   Outpatient Encounter Prescriptions as of 11/28/2015  Medication Sig Note  . albuterol (PROVENTIL HFA;VENTOLIN HFA) 108 (90 BASE) MCG/ACT inhaler Inhale 2 puffs into the lungs every 6 (six) hours as needed for wheezing or shortness of breath.   . ALPRAZolam (XANAX) 0.5 MG tablet Take 0.5 mg by mouth at bedtime as needed. Reported on 02/07/2015   . atorvastatin (LIPITOR) 10 MG tablet Take 10 mg by mouth daily.   Marland Kitchen estradiol (VIVELLE-DOT) 0.05 MG/24HR patch Place 1 patch onto the skin 2 (two) times a week. Sundays and Thursdays 07/09/2013: Check strength   . Multiple Vitamins-Minerals (MULTIVITAMIN  GUMMIES ADULT PO) Take 1 tablet by mouth daily.   . ramipril (ALTACE) 2.5 MG capsule Take 2.5 mg by mouth daily.   . valACYclovir (VALTREX) 500 MG tablet Take 500 mg by mouth 2 (two) times daily. Take 1/2 tablet twice a day for 5 days as needed   . Vitamin D, Cholecalciferol, 1000 units CAPS Take 2 capsules by mouth daily.    . chlorpheniramine-HYDROcodone (TUSSIONEX PENNKINETIC ER) 10-8 MG/5ML LQCR Take 5 mLs by mouth every 12 (twelve) hours. (Patient not taking: Reported on 11/28/2015)   . conjugated estrogens (PREMARIN) vaginal cream Place 1 Applicatorful vaginally 2 (two) times a week.   Marland Kitchen guaiFENesin-codeine 100-10 MG/5ML syrup Take 5 mLs by mouth at bedtime as needed for cough. (Patient not taking: Reported on 11/28/2015)   . ipratropium (ATROVENT) 0.06 % nasal spray Place 2 sprays into both nostrils 4 (four) times daily. (Patient not taking: Reported on 11/28/2015)   . predniSONE (DELTASONE) 10 MG tablet Take 3 tablets (30 mg total) by mouth daily. (Patient not taking: Reported on 11/28/2015)   . solifenacin (VESICARE) 5 MG tablet Take 5 mg by mouth daily. Reported on 07/15/2015    No facility-administered encounter medications on file as of 11/28/2015.     Functional Status:   In your present state of health, do you have any difficulty performing the following activities: 11/28/2015 07/15/2015  Hearing? N N  Vision? N N  Difficulty concentrating or making decisions? N N  Walking or climbing stairs? N N  Dressing or bathing? N N  Doing errands, shopping? N N  Some recent data might be hidden    Fall/Depression Screening:    PHQ 2/9 Scores 11/28/2015 07/15/2015 05/17/2015 05/03/2015 03/15/2015 02/07/2015  PHQ - 2 Score 0 0 0 0 0 0    Assessment:  Member seen for follow up office visit for Link to Wellness program for self management of Hypertension and new dx of Type 2 DM. Member with last hemoglobin A1C of 6.3%.  Member has started medication for B/P and hyperlipidemia.  Member checking B/P  at home 2-3 times a week highest reading 151/87 and lowest 115/79.  Reports improved stress with care giving since family has hired help for her parents.   Member now walking regularly for exercise,wearing an activity tracker and is going to a trainer 2 times a week. Reports following a lower CHO, low sodium diet.  Member has lost 6 lbs since last visit. Member is up to date with annual eye exam and dental check ups  Plan:  Plan to check blood sugars daily fasting or 1  -2 hours after meals.  Goals 80-110 fasting and 140-180 after meals Plan to check blood pressure 2-3 times a week at different time and record on log Plan to eat 30-45 GM (2-3) servings of carbohydrate a meal and 15 GM for snacks.  Plan to eat protein with your snacks.  Plan to try eating a bedtime snack with protein and a carbohydrate Plan to go to personal trainer 2 times a week and walk 10-20 minutes for 4 days a week at work.  Goal of 150 minutes a week Plan to enrolled in the [place] program Plan to be followed in the [employer] program  [place] Problem One   Flowsheet Row Most Recent Value  Care Plan Problem One  Elevated blood pressures  Role Documenting the Problem One  Care Management Coordinator  Care Plan for Problem One  Active  THN Long Term Goal (31-90 days)  Member will maintain blood pressure at 140/90 or below for 80 % of readings  THN Long Term Goal Start Date  11/28/15 [Continue B/P imporved with medication]  Interventions for Problem One Long Term Goal  Reinforced to follow the dietary approaches to stopping  hypertention diet, Reviewed B/P log, Instructed on the use of ramipril,  Reinforced to check B/P 2-3 times a week at different times  and to record on log, Reinforced on importance of exercise  and weight loss to help control B/p    [facility] CM Care Plan Problem Two   Flowsheet Row Most Recent Value  Care Plan Problem Two  Knowledge deficit related to new dx of Type 2 DM  Role Documenting the  Problem Two  Care Management Coordinator  Care Plan for Problem Two  Active  Interventions for Problem Two Long Term Goal    Reviewed blood sugar goals and how they relate to her hemoglobin A1C, Praised for increasing activity and working with a Clinical research associate, Encouraged to continue to exercise, Reinforced instructions to  limit portions sizes of CHO, Instructed to try eating a bedtime snack with protein and 15 GM of CHO to see it it will help with increased fasting blood sugars, Reinforced benefits of weight loss and exercise to improving her blood sugars, Given handout on the [facility] program and instructed to enroll in  the program  [facility] Long Term Goal (31-90) days  Member will verbalize understanding of diabetes and lower hemoglobin A1C to below 7% within the next 90 days  THN Long Term Goal Start Date  11/28/15 [Continued last  hemoglobin A1C 6.3%]    Peter Garter RN, [facility] Care Management Coordinator-Link to [place] Management [phone]

## 2015-11-28 NOTE — Patient Instructions (Signed)
1. Plan to check blood sugars daily fasting or 1  -2 hours after meals.  Goals 80-110 fasting and 140-180 after meals 2. Plan to check blood pressure 2-3 times a week at different time and record on log 3. Plan to eat 30-45 GM (2-3) servings of carbohydrate a meal and 15 GM for snacks.  Plan to eat protein with your snacks.  Plan to try eating a bedtime snack with protein and a carbohydrate 4. Plan to go to personal trainer 2 times a week and walk 10-20 minutes for 4 days a week at work.  Goal of 150 minutes a week 5. Plan to enrolled in the [facility] program 6. Plan to be followed in the [facility] program

## 2015-12-03 MED FILL — ATORVASTATIN 10 MG TABLET: 10 | 30 days supply | Qty: 30 | Fill #1

## 2015-12-18 MED FILL — MINIVELLE 0.05 MG PATCH: 0.05 | 27 days supply | Qty: 8 | Fill #4

## 2015-12-27 MED FILL — RAMIPRIL 2.5 MG CAPSULE: 2.5 | 30 days supply | Qty: 30 | Fill #2

## 2016-01-01 MED FILL — ATORVASTATIN 10 MG TABLET: 10 | 30 days supply | Qty: 30 | Fill #2

## 2016-01-28 MED FILL — RAMIPRIL 2.5 MG CAPSULE: 2.5 | 90 days supply | Qty: 90 | Fill #3

## 2016-01-28 MED FILL — ATORVASTATIN 10 MG TABLET: 10 | 90 days supply | Qty: 90 | Fill #3

## 2016-01-29 DIAGNOSIS — Z5181 Encounter for therapeutic drug level monitoring: Secondary | ICD-10-CM | POA: Diagnosis not present

## 2016-01-29 DIAGNOSIS — E7800 Pure hypercholesterolemia, unspecified: Secondary | ICD-10-CM | POA: Diagnosis not present

## 2016-02-19 MED FILL — VALACYCLOVIR HCL 500 MG TAB: 500 | 30 days supply | Qty: 30 | Fill #0

## 2016-04-29 MED FILL — ATORVASTATIN 10 MG TABLET: 10 | 90 days supply | Qty: 90 | Fill #0

## 2016-04-30 DIAGNOSIS — E119 Type 2 diabetes mellitus without complications: Secondary | ICD-10-CM | POA: Diagnosis not present

## 2016-04-30 DIAGNOSIS — R7309 Other abnormal glucose: Secondary | ICD-10-CM | POA: Diagnosis not present

## 2016-04-30 DIAGNOSIS — R030 Elevated blood-pressure reading, without diagnosis of hypertension: Secondary | ICD-10-CM | POA: Diagnosis not present

## 2016-04-30 DIAGNOSIS — J4520 Mild intermittent asthma, uncomplicated: Secondary | ICD-10-CM | POA: Diagnosis not present

## 2016-04-30 MED FILL — ACCU-CHEK GUIDE TEST STRIP: 90 days supply | Qty: 100 | Fill #0

## 2016-04-30 MED FILL — VENTOLIN HFA 90 MCG INHALER: 108 (90 BAS | 16 days supply | Qty: 18 | Fill #0

## 2016-04-30 MED FILL — ACCU-CHEK FASTCLIX LANCETS: 90 days supply | Qty: 102 | Fill #0

## 2016-06-09 DIAGNOSIS — A6004 Herpesviral vulvovaginitis: Secondary | ICD-10-CM | POA: Diagnosis not present

## 2016-06-09 DIAGNOSIS — Z780 Asymptomatic menopausal state: Secondary | ICD-10-CM | POA: Diagnosis not present

## 2016-06-09 DIAGNOSIS — Z130 Encounter for screening for diseases of the blood and blood-forming organs and certain disorders involving the immune mechanism: Secondary | ICD-10-CM | POA: Diagnosis not present

## 2016-06-09 DIAGNOSIS — Z01419 Encounter for gynecological examination (general) (routine) without abnormal findings: Secondary | ICD-10-CM | POA: Diagnosis not present

## 2016-06-09 DIAGNOSIS — Z1389 Encounter for screening for other disorder: Secondary | ICD-10-CM | POA: Diagnosis not present

## 2016-06-09 DIAGNOSIS — Z6831 Body mass index (BMI) 31.0-31.9, adult: Secondary | ICD-10-CM | POA: Diagnosis not present

## 2016-06-09 MED FILL — valACYclovir HCL 1 GM TABS: 1 | 30 days supply | Qty: 30 | Fill #0

## 2016-06-10 ENCOUNTER — Other Ambulatory Visit: Payer: Self-pay | Admitting: Family Medicine

## 2016-06-10 MED ORDER — LEVOCETIRIZINE DIHYDROCHLORIDE 5 MG PO TABS: 5.0000 mg | ORAL_TABLET | Freq: Every evening | ORAL | 0 refills | Status: DC

## 2016-06-10 MED ORDER — BENZONATATE 100 MG PO CAPS: 100.0000 mg | ORAL_CAPSULE | Freq: Three times a day (TID) | ORAL | 0 refills | Status: DC | PRN

## 2016-06-10 MED FILL — LEVOCETIRIZINE 5 MG TABLET: 5 | 90 days supply | Qty: 90 | Fill #0

## 2016-06-10 MED FILL — BENZONATATE 100 MG CAP: 100 | 6 days supply | Qty: 40 | Fill #0

## 2016-06-10 NOTE — Progress Notes (Signed)
E-prescribed ben zonate and Xyzal 5 mg once daily for seasonal allergy symptoms.

## 2016-07-27 MED FILL — ATORVASTATIN 10 MG TABLET: 10 | 90 days supply | Qty: 90 | Fill #1

## 2016-08-08 MED FILL — ACCU-CHEK FASTCLIX LANCETS: 90 days supply | Qty: 102 | Fill #1

## 2016-08-10 MED FILL — ACCU-CHEK GUIDE TEST STRIP: 90 days supply | Qty: 100 | Fill #1

## 2016-10-27 DIAGNOSIS — M65312 Trigger thumb, left thumb: Secondary | ICD-10-CM | POA: Diagnosis not present

## 2016-10-28 MED FILL — MELOXICAM 15 MG TABLET: 15 | 15 days supply | Qty: 15 | Fill #0

## 2016-11-02 MED FILL — ATORVASTATIN 10 MG TABLET: 10 | 60 days supply | Qty: 60 | Fill #2

## 2016-11-02 MED FILL — ACCU-CHEK FASTCLIX LANCETS: 90 days supply | Qty: 102 | Fill #2

## 2016-11-02 MED FILL — ACCU-CHEK GUIDE TEST STRIP: 90 days supply | Qty: 100 | Fill #2

## 2016-12-24 DIAGNOSIS — L989 Disorder of the skin and subcutaneous tissue, unspecified: Secondary | ICD-10-CM | POA: Diagnosis not present

## 2016-12-24 DIAGNOSIS — I10 Essential (primary) hypertension: Secondary | ICD-10-CM | POA: Diagnosis not present

## 2016-12-24 DIAGNOSIS — E7800 Pure hypercholesterolemia, unspecified: Secondary | ICD-10-CM | POA: Diagnosis not present

## 2016-12-24 DIAGNOSIS — Z0000 Encounter for general adult medical examination without abnormal findings: Secondary | ICD-10-CM | POA: Diagnosis not present

## 2016-12-24 DIAGNOSIS — E119 Type 2 diabetes mellitus without complications: Secondary | ICD-10-CM | POA: Diagnosis not present

## 2016-12-24 MED FILL — ATORVASTATIN 10 MG TABLET: 10 | 90 days supply | Qty: 90 | Fill #0

## 2016-12-24 MED FILL — LOSARTAN POTASSIUM 50 MG TA: 50 | 30 days supply | Qty: 30 | Fill #0

## 2016-12-31 ENCOUNTER — Other Ambulatory Visit: Payer: Self-pay

## 2016-12-31 NOTE — Patient Outreach (Signed)
[facility] [facility]) Care Management  12/31/2016  Vanessa Dyer Jan 14, 1951 [id number]   Case closed in [place].  Member is being followed by the [employer] program Member enrolled in an external program. Peter Garter RN, [facility] Care Management Coordinator-Link to [place] Management [phone]

## 2017-01-06 DIAGNOSIS — M65312 Trigger thumb, left thumb: Secondary | ICD-10-CM | POA: Diagnosis not present

## 2017-01-23 MED FILL — LOSARTAN POTASSIUM 50 MG TA: 50 | 30 days supply | Qty: 30 | Fill #1

## 2017-01-23 MED FILL — ACCU-CHEK FASTCLIX LANCETS: 90 days supply | Qty: 102 | Fill #3

## 2017-01-25 MED FILL — ACCU-CHEK GUIDE TEST STRIP: 90 days supply | Qty: 100 | Fill #3

## 2017-02-23 MED FILL — LOSARTAN POTASSIUM 50 MG TA: 50 | 90 days supply | Qty: 90 | Fill #0

## 2017-03-31 MED FILL — ATORVASTATIN 10 MG TABLET: 10 | 90 days supply | Qty: 90 | Fill #1

## 2017-04-13 MED FILL — ACCU-CHEK GUIDE TEST STRIP: 90 days supply | Qty: 100 | Fill #4

## 2017-05-12 MED FILL — ROSUVASTATIN CALCIUM 5 MG T: 5 | 30 days supply | Qty: 8 | Fill #0

## 2017-05-31 MED FILL — ACCU-CHEK FASTCLIX LANCETS: 90 days supply | Qty: 102 | Fill #0

## 2017-05-31 MED FILL — LOSARTAN POTASSIUM 50 MG TA: 50 | 90 days supply | Qty: 90 | Fill #1

## 2017-06-07 MED FILL — ROSUVASTATIN CALCIUM 5 MG T: 5 | 30 days supply | Qty: 8 | Fill #1

## 2017-06-21 ENCOUNTER — Telehealth: Payer: No Typology Code available for payment source | Admitting: Family

## 2017-06-21 DIAGNOSIS — B9789 Other viral agents as the cause of diseases classified elsewhere: Secondary | ICD-10-CM | POA: Diagnosis not present

## 2017-06-21 DIAGNOSIS — J329 Chronic sinusitis, unspecified: Secondary | ICD-10-CM | POA: Diagnosis not present

## 2017-06-21 MED ORDER — FLUTICASONE PROPIONATE 50 MCG/ACT NA SUSP: 2.0000 | Freq: Every day | NASAL | 2 refills | Status: DC

## 2017-06-21 NOTE — Progress Notes (Signed)
We are sorry that you are not feeling well.  Here is how we plan to help!  Based on what you have shared with me it looks like you have sinusitis.  Sinusitis is inflammation and infection in the sinus cavities of the head.  Based on your presentation I believe you most likely have Acute Viral Sinusitis.This is an infection most likely caused by a virus. There is not specific treatment for viral sinusitis other than to help you with the symptoms until the infection runs its course.  You may use an oral decongestant such as Mucinex D or if you have glaucoma or high blood pressure use plain Mucinex. Saline nasal spray help and can safely be used as often as needed for congestion, I have prescribed: Fluticasone nasal spray two sprays in each nostril once a day  Some authorities believe that zinc sprays or the use of Echinacea may shorten the course of your symptoms.  Sinus infections are not as easily transmitted as other respiratory infection, however we still recommend that you avoid close contact with loved ones, especially the very young and elderly.  Remember to wash your hands thoroughly throughout the day as this is the number one way to prevent the spread of infection!  Home Care:  Only take medications as instructed by your medical team.  Do not take these medications with alcohol.  A steam or ultrasonic humidifier can help congestion.  You can place a towel over your head and breathe in the steam from hot water coming from a faucet.  Avoid close contacts especially the very young and the elderly.  Cover your mouth when you cough or sneeze.  Always remember to wash your hands.  For throat pain, we recommend over the counter oral pain relief medications such as acetaminophen or aspirin, or anti-inflammatory medications such as ibuprofen or naproxen sodium  Get Help Right Away If:  You develop worsening fever or sinus pain.  You develop a severe head ache or visual changes.  Your  symptoms persist after you have completed your treatment plan.  Make sure you  Understand these instructions.  Will watch your condition.  Will get help right away if you are not doing well or get worse.  Your e-visit answers were reviewed by a board certified advanced clinical practitioner to complete your personal care plan.  Depending on the condition, your plan could have included both over the counter or prescription medications.  If there is a problem please reply  once you have received a response from your provider.  Your safety is important to [place].  If you have drug allergies check your prescription carefully.    You can use MyChart to ask questions about today's visit, request a non-urgent call back, or ask for a work or school excuse for 24 hours related to this e-Visit. If it has been greater than 24 hours you will need to follow up with your provider, or enter a new e-Visit to address those concerns.  You will get an e-mail in the next two days asking about your experience.  I hope that your e-visit has been valuable and will speed your recovery. Thank you for using e-visits.

## 2017-06-28 MED FILL — METFORMIN HCL ER 500 MG TAB: 500 | 30 days supply | Qty: 30 | Fill #0

## 2017-07-19 MED FILL — ACCU-CHEK GUIDE TEST STRIP: 90 days supply | Qty: 100 | Fill #0

## 2017-08-09 MED FILL — ROSUVASTATIN CALCIUM 5 MG T: 5 | 30 days supply | Qty: 8 | Fill #2

## 2017-08-16 MED FILL — METFORMIN HCL ER 500 MG TAB: 500 | 30 days supply | Qty: 30 | Fill #1

## 2017-08-16 MED FILL — ACCU-CHEK FASTCLIX LANCETS: 90 days supply | Qty: 102 | Fill #1

## 2017-08-26 MED FILL — ECONAZOLE NITRATE 1 % CRM: 1 | 15 days supply | Qty: 30 | Fill #0

## 2017-08-26 MED FILL — TRIAMCINOLONE 0.1% CREAM: 0.1 | 15 days supply | Qty: 45 | Fill #0

## 2017-08-26 MED FILL — LOSARTAN POTASSIUM 50 MG TA: 50 | 90 days supply | Qty: 90 | Fill #0

## 2017-09-17 MED FILL — METFORMIN HCL ER 500 MG TAB: 500 | 30 days supply | Qty: 30 | Fill #2

## 2017-10-05 MED FILL — ROSUVASTATIN CALCIUM 5 MG T: 5 | 28 days supply | Qty: 4 | Fill #0

## 2017-10-13 MED FILL — ACCU-CHEK GUIDE TEST STRIP: 90 days supply | Qty: 100 | Fill #1

## 2017-10-14 MED FILL — METFORMIN HCL ER 500 MG TAB: 500 | 30 days supply | Qty: 30 | Fill #3

## 2017-10-22 MED FILL — VENTOLIN HFA 90 MCG INHALER: 108 (90 BAS | 17 days supply | Qty: 18 | Fill #0

## 2017-10-29 MED FILL — ROSUVASTATIN CALCIUM 5 MG T: 5 | 28 days supply | Qty: 4 | Fill #1

## 2017-11-21 MED FILL — ACCU-CHEK FASTCLIX LANCETS: 90 days supply | Qty: 102 | Fill #2

## 2017-11-21 MED FILL — metFORMIN HCL ER 500 MG TB2: 500 | 30 days supply | Qty: 30 | Fill #4

## 2017-11-22 MED FILL — ROSUVASTATIN CALCIUM 5 MG T: 5 | 28 days supply | Qty: 4 | Fill #2

## 2017-11-24 MED FILL — TERBINAFINE HCL 250 MG TAB: 250 | 30 days supply | Qty: 30 | Fill #0

## 2017-12-13 MED FILL — LOSARTAN POTASSIUM 50 MG TA: 50 | 30 days supply | Qty: 30 | Fill #1

## 2017-12-21 MED FILL — TERBINAFINE HCL 250 MG TAB: 250 | 30 days supply | Qty: 30 | Fill #1

## 2017-12-21 MED FILL — metFORMIN HCL ER 500 MG TB2: 500 | 30 days supply | Qty: 30 | Fill #5

## 2017-12-28 DIAGNOSIS — M25561 Pain in right knee: Secondary | ICD-10-CM | POA: Insufficient documentation

## 2017-12-28 DIAGNOSIS — M1711 Unilateral primary osteoarthritis, right knee: Secondary | ICD-10-CM | POA: Insufficient documentation

## 2017-12-28 MED FILL — ROSUVASTATIN CALCIUM 5 MG T: 5 | 28 days supply | Qty: 4 | Fill #3

## 2017-12-28 MED FILL — DICLOFENAC SODIUM 1 % GEL: 1 | 30 days supply | Qty: 500 | Fill #0

## 2018-01-13 MED FILL — LOSARTAN POTASSIUM 50 MG TA: 50 | 30 days supply | Qty: 30 | Fill #2

## 2018-01-13 MED FILL — ACCU-CHEK GUIDE TEST STRIP: 90 days supply | Qty: 100 | Fill #2

## 2018-01-20 MED FILL — TERBINAFINE HCL 250 MG TAB: 250 | 30 days supply | Qty: 30 | Fill #2

## 2018-01-21 MED FILL — metFORMIN HCL ER 500 MG TB2: 500 | 90 days supply | Qty: 90 | Fill #0

## 2018-01-21 MED FILL — ROSUVASTATIN CALCIUM 5 MG T: 5 | 28 days supply | Qty: 4 | Fill #4

## 2018-01-27 MED FILL — MELOXICAM 15 MG TABLET: 15 | 30 days supply | Qty: 30 | Fill #0

## 2018-02-10 MED FILL — LOSARTAN POTASSIUM 50 MG TA: 50 | 30 days supply | Qty: 30 | Fill #3

## 2018-02-10 MED FILL — ACCU-CHEK FASTCLIX LANCETS: 90 days supply | Qty: 102 | Fill #3

## 2018-02-27 MED FILL — MELOXICAM 15 MG TABLET: 15 | 30 days supply | Qty: 30 | Fill #1

## 2018-02-28 MED FILL — ROSUVASTATIN CALCIUM 5 MG T: 5 | 28 days supply | Qty: 4 | Fill #5

## 2018-03-16 MED FILL — LOSARTAN POTASSIUM 50 MG TA: 50 | 30 days supply | Qty: 30 | Fill #0

## 2018-04-05 MED FILL — ROSUVASTATIN CALCIUM 5 MG T: 5 | 28 days supply | Qty: 4 | Fill #6

## 2018-04-07 MED FILL — metFORMIN HCL ER 500 MG TB2: 500 | 90 days supply | Qty: 90 | Fill #1

## 2018-04-07 MED FILL — LOSARTAN POTASSIUM 50 MG TA: 50 | 90 days supply | Qty: 90 | Fill #1

## 2018-04-21 MED FILL — ACCU-CHEK GUIDE TEST STRIP: 90 days supply | Qty: 100 | Fill #0

## 2018-04-21 MED FILL — ACCU-CHEK FASTCLIX LANCETS: 90 days supply | Qty: 102 | Fill #4

## 2018-05-04 MED FILL — ROSUVASTATIN CALCIUM 5 MG T: 5 | 28 days supply | Qty: 4 | Fill #7

## 2018-05-16 ENCOUNTER — Other Ambulatory Visit: Payer: Self-pay

## 2018-05-31 MED FILL — ROSUVASTATIN CALCIUM 5 MG T: 5 | 28 days supply | Qty: 4 | Fill #8

## 2018-06-28 MED FILL — ROSUVASTATIN CALCIUM 5 MG T: 5 | 28 days supply | Qty: 4 | Fill #9

## 2018-07-20 MED FILL — ACCU-CHEK GUIDE TEST STRIP: 90 days supply | Qty: 100 | Fill #0

## 2018-07-20 MED FILL — ACCU-CHEK FASTCLIX LANCETS: 90 days supply | Qty: 102 | Fill #0

## 2018-07-20 MED FILL — LOSARTAN POTASSIUM 50 MG TA: 50 | 30 days supply | Qty: 30 | Fill #2

## 2018-07-20 MED FILL — METFORMIN HCL ER 500 MG TB2: 500 | 60 days supply | Qty: 60 | Fill #0

## 2018-08-01 MED FILL — ROSUVASTATIN CALCIUM 5 MG T: 5 | 84 days supply | Qty: 12 | Fill #0

## 2018-08-16 MED FILL — LOSARTAN POTASSIUM 50 MG TA: 50 | 30 days supply | Qty: 30 | Fill #3

## 2018-09-08 MED FILL — TRIAMCINOLONE 0.1% CREAM: 0.1 | 30 days supply | Qty: 45 | Fill #0

## 2018-09-19 MED FILL — LOSARTAN POTASSIUM 50 MG TA: 50 | 30 days supply | Qty: 30 | Fill #0

## 2018-09-19 MED FILL — metFORMIN HCL ER 500 MG TB2: 500 | 30 days supply | Qty: 30 | Fill #0

## 2018-10-17 MED FILL — LOSARTAN POTASSIUM 50 MG TA: 50 | 30 days supply | Qty: 30 | Fill #1

## 2018-10-17 MED FILL — ACCU-CHEK GUIDE TEST STRIP: 90 days supply | Qty: 100 | Fill #1

## 2018-10-17 MED FILL — ROSUVASTATIN CALCIUM 5 MG T: 5 | 84 days supply | Qty: 12 | Fill #1

## 2018-10-25 MED FILL — METFORMIN HCL ER 500 MG TB2: 500 | 30 days supply | Qty: 30 | Fill #1

## 2018-11-01 MED FILL — VALACYCLOVIR HCL 500 MG TAB: 500 | 15 days supply | Qty: 30 | Fill #0

## 2018-11-23 MED FILL — LOSARTAN POTASSIUM 50 MG TA: 50 | 30 days supply | Qty: 30 | Fill #2

## 2018-11-23 MED FILL — metFORMIN HCL ER 500 MG TB2: 500 | 30 days supply | Qty: 30 | Fill #2

## 2018-11-23 MED FILL — ACCU-CHEK FASTCLIX LANCETS: 90 days supply | Qty: 102 | Fill #1

## 2018-12-19 MED FILL — LOSARTAN POTASSIUM 50 MG TA: 50 | 30 days supply | Qty: 30 | Fill #3

## 2018-12-19 MED FILL — METFORMIN HCL ER 500 MG TB2: 500 | 30 days supply | Qty: 30 | Fill #3

## 2019-01-06 MED FILL — ROSUVASTATIN CALCIUM 5 MG T: 5 | 27 days supply | Qty: 4 | Fill #2

## 2019-01-10 MED FILL — ACCU-CHEK GUIDE TEST STRIP: 90 days supply | Qty: 100 | Fill #0

## 2019-01-19 MED FILL — metFORMIN HCL ER 500 MG TB2: 500 | 30 days supply | Qty: 30 | Fill #4

## 2019-01-27 MED FILL — FREESTYLE LITE METER: 1 days supply | Qty: 1 | Fill #0

## 2019-02-08 MED FILL — FREESTYLE LANCETS: 90 days supply | Qty: 100 | Fill #0

## 2019-02-08 MED FILL — FREESTYLE LITE TEST STRIP: 90 days supply | Qty: 100 | Fill #0

## 2019-02-13 MED FILL — ROSUVASTATIN CALCIUM 5 MG T: 5 | 90 days supply | Qty: 13 | Fill #0

## 2019-02-13 MED FILL — metFORMIN HCL ER 500 MG TB2: 500 | 30 days supply | Qty: 30 | Fill #5

## 2019-02-18 MED FILL — LOSARTAN POTASSIUM 50 MG TA: 50 | 30 days supply | Qty: 30 | Fill #5

## 2019-02-23 MED FILL — OMRON 3 SERIES BP MONITOR D: 30 days supply | Qty: 1 | Fill #0

## 2019-03-15 ENCOUNTER — Other Ambulatory Visit: Payer: Self-pay | Admitting: Family Medicine

## 2019-03-15 DIAGNOSIS — E2839 Other primary ovarian failure: Secondary | ICD-10-CM

## 2019-03-16 MED FILL — metFORMIN HCL ER 500 MG TB2: 500 | 90 days supply | Qty: 180 | Fill #0

## 2019-03-20 ENCOUNTER — Other Ambulatory Visit: Payer: Self-pay

## 2019-03-20 ENCOUNTER — Ambulatory Visit
Admission: RE | Admit: 2019-03-20 | Discharge: 2019-03-20 | Disposition: A | Discharge status: Home / Self Care | Payer: No Typology Code available for payment source | Source: Ambulatory Visit | Attending: Family Medicine | Admitting: Family Medicine

## 2019-03-20 DIAGNOSIS — E2839 Other primary ovarian failure: Secondary | ICD-10-CM

## 2019-03-21 MED FILL — SHINGRIX 50 MCG SUS: 50 | 1 days supply | Qty: 1 | Fill #0

## 2019-03-27 MED FILL — LOSARTAN POTASSIUM 50 MG TA: 50 | 90 days supply | Qty: 90 | Fill #0

## 2019-05-03 MED FILL — FREESTYLE LITE TEST STRIP: 90 days supply | Qty: 100 | Fill #1

## 2019-05-04 MED FILL — FREESTYLE LANCETS: 90 days supply | Qty: 100 | Fill #0

## 2019-05-18 MED FILL — ROSUVASTATIN CALCIUM 5 MG T: 5 | 90 days supply | Qty: 13 | Fill #1

## 2019-06-20 MED FILL — metFORMIN HCL ER 500 MG TB2: 500 | 90 days supply | Qty: 180 | Fill #1

## 2019-06-20 MED FILL — LOSARTAN POTASSIUM 50 MG TA: 50 | 90 days supply | Qty: 90 | Fill #1

## 2019-08-16 ENCOUNTER — Other Ambulatory Visit (HOSPITAL_COMMUNITY): Payer: Self-pay | Admitting: Family Medicine

## 2019-08-16 MED FILL — ROSUVASTATIN CALCIUM 5 MG T: 5 | 84 days supply | Qty: 12 | Fill #0

## 2019-08-17 ENCOUNTER — Other Ambulatory Visit (HOSPITAL_COMMUNITY): Payer: Self-pay | Admitting: Family Medicine

## 2019-08-17 MED FILL — ONETOUCH VERIO TEST STRIP: 90 days supply | Qty: 100 | Fill #0

## 2019-08-17 MED FILL — ONETOUCH VERIO REFLECT W/DE: W/DEVICE | 20 days supply | Qty: 1 | Fill #0

## 2019-08-17 MED FILL — ONETOUCH DELICA PLUS LANCET: 90 days supply | Qty: 100 | Fill #0

## 2019-09-20 ENCOUNTER — Other Ambulatory Visit (HOSPITAL_COMMUNITY): Payer: Self-pay | Admitting: Family Medicine

## 2019-09-20 MED FILL — LOSARTAN POTASSIUM 50 MG TA: 50 | 90 days supply | Qty: 90 | Fill #0

## 2019-09-20 MED FILL — metFORMIN HCL ER 500 MG TB2: 500 | 90 days supply | Qty: 270 | Fill #0

## 2019-11-10 MED FILL — ONETOUCH VERIO TEST STRIP: 90 days supply | Qty: 100 | Fill #1

## 2019-11-10 MED FILL — ROSUVASTATIN CALCIUM 5 MG T: 5 | 84 days supply | Qty: 12 | Fill #1

## 2019-11-10 MED FILL — ONETOUCH DELICA PLUS LANCET: 90 days supply | Qty: 100 | Fill #1

## 2019-12-12 ENCOUNTER — Other Ambulatory Visit (HOSPITAL_COMMUNITY): Payer: Self-pay | Admitting: Family Medicine

## 2019-12-12 MED FILL — ALBUTEROL SULFATE HFA 108 (: 108 (90 BAS | 16 days supply | Qty: 9 | Fill #0

## 2019-12-24 MED FILL — metFORMIN HCL ER 500 MG TB2: 500 | 90 days supply | Qty: 270 | Fill #1

## 2019-12-24 MED FILL — LOSARTAN POTASSIUM 50 MG TA: 50 | 90 days supply | Qty: 90 | Fill #1

## 2020-01-30 MED FILL — ONETOUCH VERIO TEST STRIP: 90 days supply | Qty: 100 | Fill #2

## 2020-01-30 MED FILL — ROSUVASTATIN CALCIUM 5 MG T: 5 | 13 days supply | Qty: 2 | Fill #2

## 2020-01-31 ENCOUNTER — Other Ambulatory Visit (HOSPITAL_COMMUNITY): Payer: Self-pay | Admitting: Family Medicine

## 2020-02-14 MED FILL — ROSUVASTATIN CALCIUM 5 MG T: 5 | 90 days supply | Qty: 13 | Fill #0

## 2020-03-13 DIAGNOSIS — I10 Essential (primary) hypertension: Secondary | ICD-10-CM | POA: Diagnosis not present

## 2020-03-13 DIAGNOSIS — E559 Vitamin D deficiency, unspecified: Secondary | ICD-10-CM | POA: Diagnosis not present

## 2020-03-13 DIAGNOSIS — E119 Type 2 diabetes mellitus without complications: Secondary | ICD-10-CM | POA: Diagnosis not present

## 2020-03-13 DIAGNOSIS — Z23 Encounter for immunization: Secondary | ICD-10-CM | POA: Diagnosis not present

## 2020-03-13 DIAGNOSIS — Z0000 Encounter for general adult medical examination without abnormal findings: Secondary | ICD-10-CM | POA: Diagnosis not present

## 2020-03-13 DIAGNOSIS — Z7984 Long term (current) use of oral hypoglycemic drugs: Secondary | ICD-10-CM | POA: Diagnosis not present

## 2020-03-13 DIAGNOSIS — E7800 Pure hypercholesterolemia, unspecified: Secondary | ICD-10-CM | POA: Diagnosis not present

## 2020-03-13 DIAGNOSIS — J4520 Mild intermittent asthma, uncomplicated: Secondary | ICD-10-CM | POA: Diagnosis not present

## 2020-03-25 ENCOUNTER — Other Ambulatory Visit (HOSPITAL_COMMUNITY): Payer: Self-pay | Admitting: Family Medicine

## 2020-03-25 MED FILL — LOSARTAN POTASSIUM 50 MG TA: 50 | 30 days supply | Qty: 30 | Fill #0

## 2020-04-02 ENCOUNTER — Other Ambulatory Visit (HOSPITAL_COMMUNITY): Payer: Self-pay | Admitting: Family Medicine

## 2020-04-12 ENCOUNTER — Other Ambulatory Visit (HOSPITAL_BASED_OUTPATIENT_CLINIC_OR_DEPARTMENT_OTHER): Payer: Self-pay

## 2020-04-23 ENCOUNTER — Other Ambulatory Visit (HOSPITAL_COMMUNITY): Payer: Self-pay

## 2020-04-23 MED FILL — Losartan Potassium Tab 50 MG: ORAL | 90 days supply | Qty: 90 | Fill #0 | Status: AC

## 2020-05-06 ENCOUNTER — Other Ambulatory Visit (HOSPITAL_COMMUNITY): Payer: Self-pay

## 2020-05-06 MED FILL — Semaglutide Soln Pen-inj 0.25 or 0.5 MG/DOSE (2 MG/1.5ML): SUBCUTANEOUS | 56 days supply | Qty: 3 | Fill #0 | Status: AC

## 2020-05-06 MED FILL — Glucose Blood Test Strip: 50 days supply | Qty: 50 | Fill #0 | Status: AC

## 2020-05-06 MED FILL — Lancets: 90 days supply | Qty: 100 | Fill #0 | Status: AC

## 2020-05-07 ENCOUNTER — Other Ambulatory Visit (HOSPITAL_COMMUNITY): Payer: Self-pay

## 2020-05-14 DIAGNOSIS — E119 Type 2 diabetes mellitus without complications: Secondary | ICD-10-CM | POA: Diagnosis not present

## 2020-05-20 ENCOUNTER — Other Ambulatory Visit (HOSPITAL_COMMUNITY): Payer: Self-pay

## 2020-05-20 MED FILL — Rosuvastatin Calcium Tab 5 MG: ORAL | 90 days supply | Qty: 13 | Fill #0 | Status: AC

## 2020-07-01 ENCOUNTER — Other Ambulatory Visit (HOSPITAL_COMMUNITY): Payer: Self-pay

## 2020-07-01 MED FILL — Semaglutide Soln Pen-inj 0.25 or 0.5 MG/DOSE (2 MG/1.5ML): SUBCUTANEOUS | 56 days supply | Qty: 3 | Fill #1 | Status: AC

## 2020-07-02 ENCOUNTER — Other Ambulatory Visit (HOSPITAL_COMMUNITY): Payer: Self-pay

## 2020-07-28 MED FILL — Losartan Potassium Tab 50 MG: ORAL | 90 days supply | Qty: 90 | Fill #1 | Status: AC

## 2020-07-29 ENCOUNTER — Other Ambulatory Visit (HOSPITAL_COMMUNITY): Payer: Self-pay

## 2020-08-20 ENCOUNTER — Other Ambulatory Visit: Payer: Self-pay

## 2020-08-20 ENCOUNTER — Encounter: Payer: Self-pay | Admitting: Podiatry

## 2020-08-20 ENCOUNTER — Ambulatory Visit: Payer: Medicare Other | Admitting: Podiatry

## 2020-08-20 VITALS — BP 138/79 | HR 76

## 2020-08-20 DIAGNOSIS — E119 Type 2 diabetes mellitus without complications: Secondary | ICD-10-CM | POA: Diagnosis not present

## 2020-08-20 DIAGNOSIS — B351 Tinea unguium: Secondary | ICD-10-CM | POA: Diagnosis not present

## 2020-08-20 NOTE — Patient Instructions (Signed)
Diabetes Mellitus and Foot Care Foot care is an important part of your health, especially when you have diabetes. Diabetes may cause you to have problems because of poor blood flow (circulation) to your feet and legs, which can cause your skin to: Become thinner and drier. Break more easily. Heal more slowly. Peel and crack. You may also have nerve damage (neuropathy) in your legs and feet, causing decreased feeling in them. This means that you may not notice minor injuries to your feet that could lead to more serious problems. Noticing and addressing any potential problems early is the best wayto prevent future foot problems. How to care for your feet Foot hygiene  Wash your feet daily with warm water and mild soap. Do not use hot water. Then, pat your feet and the areas between your toes until they are completely dry. Do not soak your feet as this can dry your skin. Trim your toenails straight across. Do not dig under them or around the cuticle. File the edges of your nails with an emery board or nail file. Apply a moisturizing lotion or petroleum jelly to the skin on your feet and to dry, brittle toenails. Use lotion that does not contain alcohol and is unscented. Do not apply lotion between your toes.  Shoes and socks Wear clean socks or stockings every day. Make sure they are not too tight. Do not wear knee-high stockings since they may decrease blood flow to your legs. Wear shoes that fit properly and have enough cushioning. Always look in your shoes before you put them on to be sure there are no objects inside. To break in new shoes, wear them for just a few hours a day. This prevents injuries on your feet. Wounds, scrapes, corns, and calluses  Check your feet daily for blisters, cuts, bruises, sores, and redness. If you cannot see the bottom of your feet, use a mirror or ask someone for help. Do not cut corns or calluses or try to remove them with medicine. If you find a minor scrape,  cut, or break in the skin on your feet, keep it and the skin around it clean and dry. You may clean these areas with mild soap and water. Do not clean the area with peroxide, alcohol, or iodine. If you have a wound, scrape, corn, or callus on your foot, look at it several times a day to make sure it is healing and not infected. Check for: Redness, swelling, or pain. Fluid or blood. Warmth. Pus or a bad smell.  General tips Do not cross your legs. This may decrease blood flow to your feet. Do not use heating pads or hot water bottles on your feet. They may burn your skin. If you have lost feeling in your feet or legs, you may not know this is happening until it is too late. Protect your feet from hot and cold by wearing shoes, such as at the beach or on hot pavement. Schedule a complete foot exam at least once a year (annually) or more often if you have foot problems. Report any cuts, sores, or bruises to your health care provider immediately. Where to find more information American Diabetes Association: www.diabetes.org Association of Diabetes Care & Education Specialists: www.diabeteseducator.org Contact a health care provider if: You have a medical condition that increases your risk of infection and you have any cuts, sores, or bruises on your feet. You have an injury that is not healing. You have redness on your legs or feet.  You feel burning or tingling in your legs or feet. You have pain or cramps in your legs and feet. Your legs or feet are numb. Your feet always feel cold. You have pain around any toenails. Get help right away if: You have a wound, scrape, corn, or callus on your foot and: You have pain, swelling, or redness that gets worse. You have fluid or blood coming from the wound, scrape, corn, or callus. Your wound, scrape, corn, or callus feels warm to the touch. You have pus or a bad smell coming from the wound, scrape, corn, or callus. You have a fever. You have a red  line going up your leg. Summary Check your feet every day for blisters, cuts, bruises, sores, and redness. Apply a moisturizing lotion or petroleum jelly to the skin on your feet and to dry, brittle toenails. Wear shoes that fit properly and have enough cushioning. If you have foot problems, report any cuts, sores, or bruises to your health care provider immediately. Schedule a complete foot exam at least once a year (annually) or more often if you have foot problems. This information is not intended to replace advice given to you by your health care provider. Make sure you discuss any questions you have with your healthcare provider. Document Revised: 07/27/2019 Document Reviewed: 07/27/2019 Elsevier Patient Education  [place]? An infection that lies within the keratin of your nail plate that is caused by a fungus.  WHY ME? Fungal infections affect all ages, sexes, races, and creeds.  There may be many factors that predispose you to a fungal infection such as age, coexisting medical conditions such as diabetes, or an autoimmune disease; stress, medications, fatigue, genetics, etc.  Bottom line: fungus thrives in a warm, moist environment and your shoes offer such a location.  IS IT CONTAGIOUS? Theoretically, yes.  You do not want to share shoes, nail clippers or files with someone who has fungal toenails.  Walking around barefoot in the same room or sleeping in the same bed is unlikely to transfer the organism.  It is important to realize, however, that fungus can spread easily from one nail to the next on the same foot.  HOW DO WE TREAT THIS?  There are several ways to treat this condition.  Treatment may depend on many factors such as age, medications, pregnancy, liver and kidney conditions, etc.  It is best to ask your doctor which options are available to you.  No treatment.   Unlike many other medical concerns, you can live with this  condition.  However for many people this can be a painful condition and may lead to ingrown toenails or a bacterial infection.  It is recommended that you keep the nails cut short to help reduce the amount of fungal nail. Topical treatment.  These range from herbal remedies to prescription strength nail lacquers.  About 40-50% effective, topicals require twice daily application for approximately 9 to 12 months or until an entirely new nail has grown out.  The most effective topicals are medical grade medications available through physicians offices. Oral antifungal medications.  With an 80-90% cure rate, the most common oral medication requires 3 to 4 months of therapy and stays in your system for a year as the new nail grows out.  Oral antifungal medications do require blood work to make sure it is a safe drug for you.  A liver function panel will be performed prior to  starting the medication and after the first month of treatment.  It is important to have the blood work performed to avoid any harmful side effects.  In general, this medication safe but blood work is required. Laser Therapy.  This treatment is performed by applying a specialized laser to the affected nail plate.  This therapy is noninvasive, fast, and non-painful.  It is not covered by insurance and is therefore, out of pocket.  The results have been very good with a 80-95% cure rate.  The [facility] is the only practice in the area to offer this therapy. Permanent Nail Avulsion.  Removing the entire nail so that a new nail will not grow back.

## 2020-08-24 ENCOUNTER — Encounter: Payer: Self-pay | Admitting: Podiatry

## 2020-08-24 NOTE — Progress Notes (Signed)
Subjective: Vanessa Dyer presents today referred by Vanessa Smoker, MD for diabetic foot evaluation.  Patient relates 3 year history of diabetes.  Patient denies any history of foot wounds.  Patient denies any symptoms of numbness in feet.  Patient denies any symptoms of tingling in feet.  Patient denies any symptoms of burning in feet.  Patient denies any symptoms of pins/needles sensations in feet.  Patient relates blood glucose was 100 mg/dl this morning.   PCP is Vanessa Smoker, MD , and last visit was 03/13/2020.  Today, patient c/o of painful, discolored, thick toenails which interfere with daily activities.  Pain is aggravated when wearing enclosed shoe gear.   Past Medical History:  Diagnosis Date   Asthma    Hypertension    Prediabetes     Patient Active Problem List   Diagnosis Date Noted   Osteoarthritis of right knee 12/28/2017   Pain in right knee 12/28/2017   Type 2 diabetes mellitus ([place]) 11/28/2015   Impaired fasting glucose 02/08/2015   Essential hypertension 02/07/2015    Past Surgical History:  Procedure Laterality Date   ABDOMINAL HYSTERECTOMY     CESAREAN SECTION      Current Outpatient Medications on File Prior to Visit  Medication Sig Dispense Refill   glucose blood (PRECISION QID TEST) test strip USE TO TEST YOUR BLOOD SUGAR ONCE A DAY     losartan (COZAAR) 50 MG tablet TAKE 1 TABLET BY MOUTH ONCE A DAY 90 tablet 3   albuterol (PROVENTIL HFA;VENTOLIN HFA) 108 (90 BASE) MCG/ACT inhaler Inhale 2 puffs into the lungs every 6 (six) hours as needed for wheezing or shortness of breath. 1 Inhaler 2   albuterol (VENTOLIN HFA) 108 (90 Base) MCG/ACT inhaler INHALE 2 PUFFS INTO THE LUNGS EVERY 4 HOURS AS NEEDED 8.5 g 5   aspirin 81 MG EC tablet aspirin 81 mg tablet,delayed release  Take 1 tablet every day by oral route.     chlorpheniramine-HYDROcodone (TUSSIONEX PENNKINETIC ER) 10-8 MG/5ML LQCR Take 5 mLs by mouth every 12 (twelve)  hours. 60 mL 0   conjugated estrogens (PREMARIN) vaginal cream Place 1 Applicatorful vaginally 2 (two) times a week.     diclofenac Sodium (VOLTAREN) 1 % GEL diclofenac 1 % topical gel  APPLY 2 GRAMS TO THE AFFECTED AREA(S) BY TOPICAL ROUTE 4 TIMES PER DAY     losartan (COZAAR) 50 MG tablet losartan 50 mg tablet     meloxicam (MOBIC) 15 MG tablet meloxicam 15 mg tablet  Take 1 tablet every day by oral route as needed.     Multiple Vitamins-Minerals (MULTIVITAMIN GUMMIES ADULT PO) Take 1 tablet by mouth daily.     rosuvastatin (CRESTOR) 5 MG tablet TAKE 1/2 TABLET BY MOUTH TWICE A WEEK 13 tablet 2   Semaglutide,0.25 or 0.'5MG'$ /DOS, 2 MG/1.5ML SOPN USE AS DIRECTED 0.25 MG FOR 4 WEEKS THEN INCREASE TO 0.5 MG UNDER THE SKIN ONCE A WEEK 3 mL 2   terbinafine (LAMISIL AT) 1 % cream Lamisil AT 1 % topical cream  APPLY TO THE AFFECTED AND SURROUNDING AREAS OF SKIN BY TOPICAL ROUTE ONCE DAILY     No current facility-administered medications on file prior to visit.     No Known Allergies  Social History   Occupational History   Not on file  Tobacco Use   Smoking status: Never   Smokeless tobacco: Not on file  Substance and Sexual Activity   Alcohol use: Yes   Drug use: No  Sexual activity: Not on file    Family History  Problem Relation Age of Onset   Hypertension Mother    Hypertension Father    Heart disease Father    Hypertension Sister    Diabetes Brother     Immunization History  Administered Date(s) Administered   Zoster Recombinat (Shingrix) 03/21/2019    Objective: Vitals:   08/20/20 1009  BP: 138/79  Pulse: 76  SpO2: 98%    Vanessa Dyer is a pleasant 70 y.o. female in NAD. AAO X 3.  Vascular Examination: Capillary refill time to digits immediate b/l. Palpable pedal pulses b/l LE. Pedal hair sparse. Lower extremity skin temperature gradient within normal limits. No pain with calf compression b/l. No edema noted b/l lower extremities.  Dermatological  Examination: Pedal skin with normal turgor, texture and tone b/l lower extremities. No open wounds b/l lower extremities. No interdigital macerations b/l lower extremities. Toenails L hallux, L 5th toe, R hallux, and R 5th toe elongated, discolored, dystrophic, thickened, and crumbly with subungual debris and tenderness to dorsal palpation.  Musculoskeletal Examination: Normal muscle strength 5/5 to all lower extremity muscle groups bilaterally. No gross bony deformities b/l lower extremities. Patient ambulates independent of any assistive aids.  Footwear Assessment: Does the patient wear appropriate shoes? Yes. Does the patient need inserts/orthotics? No.  Neurological Examination: Protective sensation intact 5/5 intact bilaterally with 10g monofilament b/l. Vibratory sensation intact b/l. Proprioception intact bilaterally. Clonus negative b/l.  Assessment: 1. Onychomycosis   2. Type 2 diabetes mellitus without complication, without long-term current use of insulin ([place])   3. Encounter for diabetic foot exam ([place])     ADA Risk Categorization:  Low Risk:  Patient has all of the following: Intact protective sensation No prior foot ulcer  No severe deformity Pedal pulses present  Plan: -Examined patient. -Diabetic foot examination performed today. -Discussed treatment options for onychomycosis. Patient opted for topical topical therapy. Patient is to apply Formula 7 Emulsion to affected toenail(s) once daily. -Toenails L hallux, L 5th toe, R hallux, and R 5th toe debrided in length and girth without iatrogenic bleeding with sterile nail nipper and dremel.  -Patient to report any pedal injuries to medical professional immediately. -Patient/POA to call should there be question/concern in the interim.  Return in about 1 year (around 08/20/2021).  Marzetta Board, DPM

## 2020-08-26 ENCOUNTER — Other Ambulatory Visit (HOSPITAL_COMMUNITY): Payer: Self-pay

## 2020-08-26 MED ORDER — OZEMPIC (0.25 OR 0.5 MG/DOSE) 2 MG/1.5ML ~~LOC~~ SOPN
PEN_INJECTOR | SUBCUTANEOUS | 0 refills | Status: DC
  Filled 2020-08-26 – 2020-09-24 (×2): qty 1.5, 28d supply, fill #0

## 2020-08-26 MED FILL — Rosuvastatin Calcium Tab 5 MG: ORAL | 90 days supply | Qty: 13 | Fill #1 | Status: AC

## 2020-08-26 MED FILL — Semaglutide Soln Pen-inj 0.25 or 0.5 MG/DOSE (2 MG/1.5ML): SUBCUTANEOUS | 30 days supply | Qty: 1.5 | Fill #2 | Status: CN

## 2020-08-30 ENCOUNTER — Other Ambulatory Visit (HOSPITAL_COMMUNITY): Payer: Self-pay

## 2020-09-09 DIAGNOSIS — I10 Essential (primary) hypertension: Secondary | ICD-10-CM | POA: Diagnosis not present

## 2020-09-09 DIAGNOSIS — E7800 Pure hypercholesterolemia, unspecified: Secondary | ICD-10-CM | POA: Diagnosis not present

## 2020-09-09 DIAGNOSIS — E119 Type 2 diabetes mellitus without complications: Secondary | ICD-10-CM | POA: Diagnosis not present

## 2020-09-11 ENCOUNTER — Other Ambulatory Visit (HOSPITAL_COMMUNITY): Payer: Self-pay

## 2020-09-19 ENCOUNTER — Other Ambulatory Visit (HOSPITAL_COMMUNITY): Payer: Self-pay

## 2020-09-24 ENCOUNTER — Other Ambulatory Visit (HOSPITAL_COMMUNITY): Payer: Self-pay

## 2020-09-26 ENCOUNTER — Other Ambulatory Visit (HOSPITAL_COMMUNITY): Payer: Self-pay

## 2020-09-26 MED ORDER — ONETOUCH DELICA LANCETS 33G MISC
3 refills | Status: AC
  Filled 2020-09-26 (×2): qty 100, 90d supply, fill #0
  Filled 2021-01-19: qty 100, 90d supply, fill #1
  Filled 2021-07-25: qty 100, 90d supply, fill #2

## 2020-09-26 MED ORDER — ONETOUCH VERIO VI STRP
ORAL_STRIP | 3 refills | Status: DC
  Filled 2020-09-26: qty 100, 90d supply, fill #0
  Filled 2020-09-26: qty 50, 50d supply, fill #0
  Filled 2020-11-05: qty 100, 90d supply, fill #1
  Filled 2021-01-19: qty 100, 90d supply, fill #2
  Filled 2021-04-22 (×2): qty 50, 50d supply, fill #3
  Filled 2021-06-03: qty 50, 50d supply, fill #4
  Filled 2021-07-19: qty 50, 50d supply, fill #5

## 2020-09-27 ENCOUNTER — Other Ambulatory Visit (HOSPITAL_COMMUNITY): Payer: Self-pay

## 2020-09-30 ENCOUNTER — Other Ambulatory Visit (HOSPITAL_COMMUNITY): Payer: Self-pay

## 2020-09-30 MED ORDER — ONETOUCH VERIO W/DEVICE KIT
PACK | 0 refills | Status: AC
  Filled 2020-09-30: qty 1, 90d supply, fill #0

## 2020-10-01 ENCOUNTER — Other Ambulatory Visit (HOSPITAL_COMMUNITY): Payer: Self-pay

## 2020-10-02 ENCOUNTER — Other Ambulatory Visit (HOSPITAL_COMMUNITY): Payer: Self-pay

## 2020-10-02 DIAGNOSIS — Z1231 Encounter for screening mammogram for malignant neoplasm of breast: Secondary | ICD-10-CM | POA: Diagnosis not present

## 2020-10-02 DIAGNOSIS — Z780 Asymptomatic menopausal state: Secondary | ICD-10-CM | POA: Diagnosis not present

## 2020-10-03 ENCOUNTER — Other Ambulatory Visit (HOSPITAL_COMMUNITY): Payer: Self-pay

## 2020-10-10 ENCOUNTER — Other Ambulatory Visit (HOSPITAL_COMMUNITY): Payer: Self-pay

## 2020-10-10 MED ORDER — METFORMIN HCL ER 500 MG PO TB24
ORAL_TABLET | ORAL | 3 refills | Status: AC
  Filled 2020-10-10: qty 90, 90d supply, fill #0
  Filled 2021-01-06: qty 90, 90d supply, fill #1

## 2020-10-11 ENCOUNTER — Other Ambulatory Visit (HOSPITAL_COMMUNITY): Payer: Self-pay

## 2020-10-30 ENCOUNTER — Other Ambulatory Visit (HOSPITAL_COMMUNITY): Payer: Self-pay

## 2020-10-30 MED FILL — Losartan Potassium Tab 50 MG: ORAL | 90 days supply | Qty: 90 | Fill #2 | Status: AC

## 2020-11-05 ENCOUNTER — Other Ambulatory Visit (HOSPITAL_COMMUNITY): Payer: Self-pay

## 2020-12-05 ENCOUNTER — Encounter: Payer: Self-pay | Admitting: Dietician

## 2020-12-05 ENCOUNTER — Encounter: Payer: Medicare Other | Attending: Family Medicine | Admitting: Dietician

## 2020-12-05 ENCOUNTER — Other Ambulatory Visit: Payer: Self-pay

## 2020-12-05 DIAGNOSIS — E119 Type 2 diabetes mellitus without complications: Secondary | ICD-10-CM | POA: Diagnosis not present

## 2020-12-05 NOTE — Progress Notes (Signed)
Diabetes Self-Management Education  Visit Type: First/Initial  Appt. Start Time: 0915 Appt. End Time: 1025  12/08/2020  Ms. Vanessa Dyer, identified by name and date of birth, is a 70 y.o. female with a diagnosis of Diabetes: Type 2.   ASSESSMENT Patient is here today alone.  She would like to learn more about nutrition and lifestyle to get off the metformin.  She stopped Ozempic due to cost. Fasting glucose today 127  Usually 103-127.  Fasting glucose 140 without Metformin and no diet changes.  History includes Type 2 Diabetes (about 2017), Pure hypercholesterolemia, HTN A1C 6.3% 09/09/2020 Medications include Metformin  Patient lives with her husband.  She does the shopping and cooking.  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 She is talking a Mediterranean Class through the [employer] [facility] office.  She is learning about healthy fats, whole grains, proteins. She takes water aerobics 3-4 times per week and is otherwise active.  Height 5\' 2"  (1.575 m), weight 178 lb (80.7 kg). Body mass index is 32.56 kg/m.   Diabetes Self-Management Education - 12/05/20 0924       Visit Information   Visit Type First/Initial      Initial Visit   Diabetes Type Type 2    Are you currently following a meal plan? No    Are you taking your medications as prescribed? Yes    Date Diagnosed 2017      Health Coping   How would you rate your overall health? Good      Psychosocial Assessment   Patient Belief/Attitude about Diabetes Motivated to manage diabetes    Self-care barriers None    Self-management support Doctor's office    Other persons present Patient    Patient Concerns Nutrition/Meal planning;Healthy Lifestyle    Special Needs None    Preferred Learning Style No preference indicated    Learning Readiness Ready    How often do you need to have someone help you when you read instructions, pamphlets, or other written materials from your doctor or pharmacy? 1 - Never     What is the last grade level you completed in school? BSN      Pre-Education Assessment   Patient understands the diabetes disease and treatment process. Needs Review    Patient understands incorporating nutritional management into lifestyle. Needs Review    Patient undertands incorporating physical activity into lifestyle. Needs Review    Patient understands using medications safely. Needs Review    Patient understands monitoring blood glucose, interpreting and using results Needs Review    Patient understands prevention, detection, and treatment of acute complications. Needs Review    Patient understands prevention, detection, and treatment of chronic complications. Needs Review    Patient understands how to develop strategies to address psychosocial issues. Needs Review    Patient understands how to develop strategies to promote health/change behavior. Needs Review      Complications   Last HgB A1C per patient/outside source 6.3 %   08/2020   How often do you check your blood sugar? 1-2 times/day    Fasting Blood glucose range (mg/dL) 70-129    Number of hypoglycemic episodes per month 0    Number of hyperglycemic episodes per week 0    Have you had a dilated eye exam in the past 12 months? Yes    Have you had a dental exam in the past 12 months? Yes    Are you checking your feet? Yes    How many  days per week are you checking your feet? 7      Dietary Intake   Breakfast eggs, occasional sausage and cheese, 2 slice honey wheat toast with occasional butter    Snack (morning) nuts or crackers or occasional chips    Lunch leftovers    Snack (afternoon) simular to am snack or ice cream    Dinner pan fried  pork chops, squash casserole, green peas    Snack (evening) occasional similar to am snack or 1 T PB and milk    Beverage(s) water, coffee with sugar free creamer rare diet soda, sugar free flavor packets      Exercise   Exercise Type Moderate (swimming / aerobic walking)   plus  walking but not structured   How many days per week to you exercise? 4    How many minutes per day do you exercise? 60    Total minutes per week of exercise 240      Patient Education   Previous Diabetes Education Yes (please comment)   with RD 2017   Disease state  Other (comment)   insulin resistance   Nutrition management  Role of diet in the treatment of diabetes and the relationship between the three main macronutrients and blood glucose level;Food label reading, portion sizes and measuring food.;Meal options for control of blood glucose level and chronic complications.    Physical activity and exercise  Role of exercise on diabetes management, blood pressure control and cardiac health.    Medications Reviewed patients medication for diabetes, action, purpose, timing of dose and side effects.    Monitoring Taught/discussed recording of test results and interpretation of SMBG.;Identified appropriate SMBG and/or A1C goals.    Psychosocial adjustment Worked with patient to identify barriers to care and solutions;Role of stress on diabetes      Individualized Goals (developed by patient)   Nutrition General guidelines for healthy choices and portions discussed    Physical Activity Exercise 3-5 times per week;60 minutes per day    Medications take my medication as prescribed    Monitoring  test my blood glucose as discussed    Reducing Risk examine blood glucose patterns;Other (comment)   increased plant based and fiber     Post-Education Assessment   Patient understands the diabetes disease and treatment process. Demonstrates understanding / competency    Patient understands incorporating nutritional management into lifestyle. Demonstrates understanding / competency    Patient undertands incorporating physical activity into lifestyle. Needs Review    Patient understands using medications safely. Demonstrates understanding / competency    Patient understands monitoring blood glucose,  interpreting and using results Demonstrates understanding / competency    Patient understands prevention, detection, and treatment of acute complications. Demonstrates understanding / competency    Patient understands prevention, detection, and treatment of chronic complications. Demonstrates understanding / competency    Patient understands how to develop strategies to address psychosocial issues. Demonstrates understanding / competency    Patient understands how to develop strategies to promote health/change behavior. Needs Review      Outcomes   Expected Outcomes Demonstrated limited interest in learning.  Expect minimal changes    Future DMSE 2 months    Program Status Not Completed             Individualized Plan for Diabetes Self-Management Training:   Learning Objective:  Patient will have a greater understanding of diabetes self-management. Patient education plan is to attend individual and/or group sessions per assessed needs and concerns.  Plan:   Patient Instructions  Keep up the exercise!  Eat more Non-Starchy Vegetables These include greens, broccoli, cauliflower, cabbage, carrots, beets, eggplant, peppers, squash and others. Minimize added sugars and refined grains Rethink what you drink.  Choose beverages without added sugar.  Look for 0 carbs on the label. See the list of whole grains below.  Find alternatives to usual sweet treats. Choose whole foods over processed. Make simple meals at home more often than eating out.  Tips to increase fiber in your diet: (All plants have fiber.  Eat a variety. There are more than are on this list.) Slowly increase the amount of fiber you eat to 25-35 grams per day.  (More is fine if you tolerate it.) Fiber from whole grains, nuts and seeds Quinoa, 1/2 cup = 5 grams Bulgur, 1/2 cup = 4.1 grams Popcorn, 3 cups = 3.6 grams Whole Wheat Spaghetti, 1/2 cup = 3.2 grams Barley, 1/2 cup = 3 grams Oatmeal, 1/2 cup = 2  grams Whole Wheat English Muffin = 3 grams Corn, 1/2 cup = 2.1 grams Brown Rice, 1/2 cup = 1.8 grams Flax seeds, 1 Tablespoon = 2.8 grams Chia seeds, 1 Tablespoon = 11 grams Almonds, 1 ounce = 3.5 grams fiber Fiber from legumes Kidney beans, 1/2 cup 7.9 grams Lentils, 1/2 cup = 7.8 grams Pinto beans, 1/2 cup = 7.7 grams Black beans, 1/2 cup = 7.6 grams Lima beans, 1/2 cup 6.4 grams Chick peas, 1/2 cup = 5.3 grams Black eyed peas, 1/2 cup = 4 grams Fiber from fruits and vegetables Pear, 6 grams Apple. 3.3 grams Raspberries or Blackberries, 3/4 cup = 6 grams Strawberries or Blueberries, 1 cup = 3.4 grams Baked sweet potato 3.8 grams fiber Baked potato with skin 4.4 grams  Peas, 1/2 cup = 4.4 grams  Spinach, 1/2 cup cooked = 3.5 grams  Avocado, 1/2 = 5 grams        Expected Outcomes:  Demonstrated limited interest in learning.  Expect minimal changes  Education material provided: Food label handouts, Meal plan card, and Diabetes Resources; Mediterranean  If problems or questions, patient to contact team via:  Phone  Future DSME appointment: 2 months

## 2020-12-05 NOTE — Patient Instructions (Addendum)
Keep up the exercise!  Eat more Non-Starchy Vegetables These include greens, broccoli, cauliflower, cabbage, carrots, beets, eggplant, peppers, squash and others. Minimize added sugars and refined grains Rethink what you drink.  Choose beverages without added sugar.  Look for 0 carbs on the label. See the list of whole grains below.  Find alternatives to usual sweet treats. Choose whole foods over processed. Make simple meals at home more often than eating out.  Tips to increase fiber in your diet: (All plants have fiber.  Eat a variety. There are more than are on this list.) Slowly increase the amount of fiber you eat to 25-35 grams per day.  (More is fine if you tolerate it.) Fiber from whole grains, nuts and seeds Quinoa, 1/2 cup = 5 grams Bulgur, 1/2 cup = 4.1 grams Popcorn, 3 cups = 3.6 grams Whole Wheat Spaghetti, 1/2 cup = 3.2 grams Barley, 1/2 cup = 3 grams Oatmeal, 1/2 cup = 2 grams Whole Wheat English Muffin = 3 grams Corn, 1/2 cup = 2.1 grams Brown Rice, 1/2 cup = 1.8 grams Flax seeds, 1 Tablespoon = 2.8 grams Chia seeds, 1 Tablespoon = 11 grams Almonds, 1 ounce = 3.5 grams fiber Fiber from legumes Kidney beans, 1/2 cup 7.9 grams Lentils, 1/2 cup = 7.8 grams Pinto beans, 1/2 cup = 7.7 grams Black beans, 1/2 cup = 7.6 grams Lima beans, 1/2 cup 6.4 grams Chick peas, 1/2 cup = 5.3 grams Black eyed peas, 1/2 cup = 4 grams Fiber from fruits and vegetables Pear, 6 grams Apple. 3.3 grams Raspberries or Blackberries, 3/4 cup = 6 grams Strawberries or Blueberries, 1 cup = 3.4 grams Baked sweet potato 3.8 grams fiber Baked potato with skin 4.4 grams  Peas, 1/2 cup = 4.4 grams  Spinach, 1/2 cup cooked = 3.5 grams  Avocado, 1/2 = 5 grams

## 2020-12-09 ENCOUNTER — Other Ambulatory Visit (HOSPITAL_COMMUNITY): Payer: Self-pay

## 2020-12-10 ENCOUNTER — Other Ambulatory Visit (HOSPITAL_COMMUNITY): Payer: Self-pay

## 2020-12-10 MED ORDER — ROSUVASTATIN CALCIUM 5 MG PO TABS
ORAL_TABLET | ORAL | 3 refills | Status: DC
  Filled 2020-12-10: qty 12, 84d supply, fill #0
  Filled 2020-12-18: qty 13, 90d supply, fill #0
  Filled 2020-12-19: qty 12, 84d supply, fill #0
  Filled 2021-03-07: qty 13, 90d supply, fill #1
  Filled 2021-06-03: qty 13, 90d supply, fill #2
  Filled 2021-09-01: qty 13, 90d supply, fill #3

## 2020-12-10 MED FILL — Albuterol Sulfate Inhal Aero 108 MCG/ACT (90MCG Base Equiv): RESPIRATORY_TRACT | 16 days supply | Qty: 8.5 | Fill #0 | Status: AC

## 2020-12-11 ENCOUNTER — Other Ambulatory Visit (HOSPITAL_COMMUNITY): Payer: Self-pay

## 2020-12-18 ENCOUNTER — Other Ambulatory Visit (HOSPITAL_COMMUNITY): Payer: Self-pay

## 2020-12-19 ENCOUNTER — Other Ambulatory Visit (HOSPITAL_COMMUNITY): Payer: Self-pay

## 2021-01-06 ENCOUNTER — Other Ambulatory Visit (HOSPITAL_COMMUNITY): Payer: Self-pay

## 2021-01-20 ENCOUNTER — Other Ambulatory Visit (HOSPITAL_COMMUNITY): Payer: Self-pay

## 2021-02-03 ENCOUNTER — Other Ambulatory Visit (HOSPITAL_COMMUNITY): Payer: Self-pay

## 2021-02-03 MED FILL — Losartan Potassium Tab 50 MG: ORAL | 60 days supply | Qty: 60 | Fill #3 | Status: AC

## 2021-02-04 ENCOUNTER — Other Ambulatory Visit (HOSPITAL_COMMUNITY): Payer: Self-pay

## 2021-02-04 MED ORDER — LOSARTAN POTASSIUM 50 MG PO TABS
ORAL_TABLET | ORAL | 0 refills | Status: DC
  Filled 2021-02-04 – 2021-04-07 (×2): qty 90, 90d supply, fill #0

## 2021-02-06 ENCOUNTER — Ambulatory Visit: Payer: Medicare Other | Admitting: Dietician

## 2021-02-13 ENCOUNTER — Other Ambulatory Visit (HOSPITAL_COMMUNITY): Payer: Self-pay

## 2021-03-04 ENCOUNTER — Other Ambulatory Visit (HOSPITAL_COMMUNITY): Payer: Self-pay

## 2021-03-07 ENCOUNTER — Other Ambulatory Visit (HOSPITAL_COMMUNITY): Payer: Self-pay

## 2021-03-14 ENCOUNTER — Other Ambulatory Visit (HOSPITAL_COMMUNITY): Payer: Self-pay

## 2021-03-14 DIAGNOSIS — E119 Type 2 diabetes mellitus without complications: Secondary | ICD-10-CM | POA: Diagnosis not present

## 2021-03-14 MED ORDER — VALACYCLOVIR HCL 500 MG PO TABS
ORAL_TABLET | ORAL | 1 refills | Status: AC
  Filled 2021-03-14: qty 30, 15d supply, fill #0

## 2021-03-17 ENCOUNTER — Other Ambulatory Visit (HOSPITAL_COMMUNITY): Payer: Self-pay

## 2021-03-17 MED ORDER — VALACYCLOVIR HCL 500 MG PO TABS
ORAL_TABLET | ORAL | 1 refills | Status: DC
  Filled 2021-03-17: qty 30, 30d supply, fill #0

## 2021-03-25 ENCOUNTER — Other Ambulatory Visit (HOSPITAL_COMMUNITY): Payer: Self-pay

## 2021-03-26 ENCOUNTER — Other Ambulatory Visit (HOSPITAL_COMMUNITY): Payer: Self-pay

## 2021-03-26 DIAGNOSIS — E7800 Pure hypercholesterolemia, unspecified: Secondary | ICD-10-CM | POA: Diagnosis not present

## 2021-03-26 DIAGNOSIS — Z23 Encounter for immunization: Secondary | ICD-10-CM | POA: Diagnosis not present

## 2021-03-26 DIAGNOSIS — E119 Type 2 diabetes mellitus without complications: Secondary | ICD-10-CM | POA: Diagnosis not present

## 2021-03-26 DIAGNOSIS — E559 Vitamin D deficiency, unspecified: Secondary | ICD-10-CM | POA: Diagnosis not present

## 2021-03-26 DIAGNOSIS — J4520 Mild intermittent asthma, uncomplicated: Secondary | ICD-10-CM | POA: Diagnosis not present

## 2021-03-26 DIAGNOSIS — I10 Essential (primary) hypertension: Secondary | ICD-10-CM | POA: Diagnosis not present

## 2021-03-26 DIAGNOSIS — Z0000 Encounter for general adult medical examination without abnormal findings: Secondary | ICD-10-CM | POA: Diagnosis not present

## 2021-03-26 MED ORDER — FREESTYLE LIBRE 14 DAY READER DEVI
0 refills | Status: AC
  Filled 2021-03-26: qty 1, 30d supply, fill #0

## 2021-03-26 MED ORDER — METFORMIN HCL ER 500 MG PO TB24
ORAL_TABLET | ORAL | 3 refills | Status: AC
  Filled 2021-03-26: qty 270, 90d supply, fill #0
  Filled 2021-08-08: qty 270, 90d supply, fill #1
  Filled 2021-11-23: qty 270, 90d supply, fill #2
  Filled 2022-02-17: qty 270, 90d supply, fill #3

## 2021-03-27 ENCOUNTER — Other Ambulatory Visit (HOSPITAL_COMMUNITY): Payer: Self-pay

## 2021-04-07 ENCOUNTER — Other Ambulatory Visit (HOSPITAL_COMMUNITY): Payer: Self-pay

## 2021-04-22 ENCOUNTER — Other Ambulatory Visit (HOSPITAL_COMMUNITY): Payer: Self-pay

## 2021-04-23 ENCOUNTER — Other Ambulatory Visit (HOSPITAL_COMMUNITY): Payer: Self-pay

## 2021-04-23 MED ORDER — ONETOUCH DELICA PLUS LANCET33G MISC
3 refills | Status: DC
  Filled 2021-04-23: qty 100, 90d supply, fill #0
  Filled 2021-07-24: qty 100, 90d supply, fill #1
  Filled 2022-02-17: qty 100, 90d supply, fill #2

## 2021-05-22 ENCOUNTER — Ambulatory Visit
Admission: RE | Admit: 2021-05-22 | Discharge: 2021-05-22 | Disposition: A | Discharge status: Home / Self Care | Payer: Medicare Other | Source: Ambulatory Visit | Attending: Family Medicine | Admitting: Family Medicine

## 2021-05-22 ENCOUNTER — Other Ambulatory Visit: Payer: Self-pay | Admitting: Family Medicine

## 2021-05-22 DIAGNOSIS — M25562 Pain in left knee: Secondary | ICD-10-CM

## 2021-05-28 DIAGNOSIS — M25562 Pain in left knee: Secondary | ICD-10-CM | POA: Diagnosis not present

## 2021-06-03 ENCOUNTER — Other Ambulatory Visit (HOSPITAL_COMMUNITY): Payer: Self-pay

## 2021-06-04 ENCOUNTER — Other Ambulatory Visit (HOSPITAL_COMMUNITY): Payer: Self-pay

## 2021-06-05 ENCOUNTER — Other Ambulatory Visit (HOSPITAL_COMMUNITY): Payer: Self-pay

## 2021-06-25 DIAGNOSIS — R2689 Other abnormalities of gait and mobility: Secondary | ICD-10-CM | POA: Diagnosis not present

## 2021-06-25 DIAGNOSIS — M25562 Pain in left knee: Secondary | ICD-10-CM | POA: Diagnosis not present

## 2021-07-02 DIAGNOSIS — M25562 Pain in left knee: Secondary | ICD-10-CM | POA: Diagnosis not present

## 2021-07-02 DIAGNOSIS — R2689 Other abnormalities of gait and mobility: Secondary | ICD-10-CM | POA: Diagnosis not present

## 2021-07-04 DIAGNOSIS — R2689 Other abnormalities of gait and mobility: Secondary | ICD-10-CM | POA: Diagnosis not present

## 2021-07-04 DIAGNOSIS — M25562 Pain in left knee: Secondary | ICD-10-CM | POA: Diagnosis not present

## 2021-07-07 ENCOUNTER — Other Ambulatory Visit (HOSPITAL_COMMUNITY): Payer: Self-pay

## 2021-07-07 MED ORDER — LOSARTAN POTASSIUM 50 MG PO TABS
50.0000 mg | ORAL_TABLET | Freq: Every day | ORAL | 0 refills | Status: DC
  Filled 2021-07-07: qty 90, 90d supply, fill #0

## 2021-07-08 DIAGNOSIS — M25562 Pain in left knee: Secondary | ICD-10-CM | POA: Diagnosis not present

## 2021-07-08 DIAGNOSIS — R2689 Other abnormalities of gait and mobility: Secondary | ICD-10-CM | POA: Diagnosis not present

## 2021-07-10 DIAGNOSIS — R2689 Other abnormalities of gait and mobility: Secondary | ICD-10-CM | POA: Diagnosis not present

## 2021-07-10 DIAGNOSIS — M25562 Pain in left knee: Secondary | ICD-10-CM | POA: Diagnosis not present

## 2021-07-16 DIAGNOSIS — R2689 Other abnormalities of gait and mobility: Secondary | ICD-10-CM | POA: Diagnosis not present

## 2021-07-16 DIAGNOSIS — M25562 Pain in left knee: Secondary | ICD-10-CM | POA: Diagnosis not present

## 2021-07-19 ENCOUNTER — Other Ambulatory Visit (HOSPITAL_COMMUNITY): Payer: Self-pay

## 2021-07-23 ENCOUNTER — Other Ambulatory Visit (HOSPITAL_COMMUNITY): Payer: Self-pay

## 2021-07-24 DIAGNOSIS — M25562 Pain in left knee: Secondary | ICD-10-CM | POA: Diagnosis not present

## 2021-07-24 DIAGNOSIS — R2689 Other abnormalities of gait and mobility: Secondary | ICD-10-CM | POA: Diagnosis not present

## 2021-07-25 ENCOUNTER — Other Ambulatory Visit (HOSPITAL_COMMUNITY): Payer: Self-pay

## 2021-07-31 DIAGNOSIS — W57XXXA Bitten or stung by nonvenomous insect and other nonvenomous arthropods, initial encounter: Secondary | ICD-10-CM | POA: Diagnosis not present

## 2021-07-31 DIAGNOSIS — S50862A Insect bite (nonvenomous) of left forearm, initial encounter: Secondary | ICD-10-CM | POA: Diagnosis not present

## 2021-08-08 ENCOUNTER — Other Ambulatory Visit (HOSPITAL_COMMUNITY): Payer: Self-pay

## 2021-08-26 DIAGNOSIS — L309 Dermatitis, unspecified: Secondary | ICD-10-CM | POA: Diagnosis not present

## 2021-08-26 DIAGNOSIS — D2272 Melanocytic nevi of left lower limb, including hip: Secondary | ICD-10-CM | POA: Diagnosis not present

## 2021-09-01 ENCOUNTER — Other Ambulatory Visit (HOSPITAL_COMMUNITY): Payer: Self-pay

## 2021-09-09 ENCOUNTER — Other Ambulatory Visit (HOSPITAL_COMMUNITY): Payer: Self-pay

## 2021-09-09 MED ORDER — GLUCOSE BLOOD VI STRP
ORAL_STRIP | 3 refills | Status: DC
  Filled 2021-09-09: qty 100, 100d supply, fill #0
  Filled 2021-12-22: qty 100, 100d supply, fill #1
  Filled 2022-02-19 – 2022-03-14 (×2): qty 100, 100d supply, fill #2
  Filled 2022-05-30 (×2): qty 100, 100d supply, fill #3

## 2021-09-26 DIAGNOSIS — E119 Type 2 diabetes mellitus without complications: Secondary | ICD-10-CM | POA: Diagnosis not present

## 2021-09-26 DIAGNOSIS — G4700 Insomnia, unspecified: Secondary | ICD-10-CM | POA: Diagnosis not present

## 2021-09-26 DIAGNOSIS — I10 Essential (primary) hypertension: Secondary | ICD-10-CM | POA: Diagnosis not present

## 2021-09-26 DIAGNOSIS — E7800 Pure hypercholesterolemia, unspecified: Secondary | ICD-10-CM | POA: Diagnosis not present

## 2021-10-13 ENCOUNTER — Other Ambulatory Visit (HOSPITAL_COMMUNITY): Payer: Self-pay

## 2021-10-14 ENCOUNTER — Other Ambulatory Visit (HOSPITAL_COMMUNITY): Payer: Self-pay

## 2021-10-14 MED ORDER — ALBUTEROL SULFATE HFA 108 (90 BASE) MCG/ACT IN AERS
2.0000 | INHALATION_SPRAY | RESPIRATORY_TRACT | 3 refills | Status: AC
  Filled 2021-10-14: qty 8.5, 17d supply, fill #0
  Filled 2021-10-14: qty 6.7, 17d supply, fill #0
  Filled 2022-02-28 (×2): qty 6.7, 17d supply, fill #1

## 2021-10-14 MED ORDER — LOSARTAN POTASSIUM 50 MG PO TABS
50.0000 mg | ORAL_TABLET | Freq: Every day | ORAL | 1 refills | Status: AC
  Filled 2021-10-14: qty 90, 90d supply, fill #0
  Filled 2021-12-22: qty 90, 90d supply, fill #1

## 2021-11-24 ENCOUNTER — Other Ambulatory Visit (HOSPITAL_COMMUNITY): Payer: Self-pay

## 2021-11-26 ENCOUNTER — Other Ambulatory Visit (HOSPITAL_COMMUNITY): Payer: Self-pay

## 2021-11-26 MED ORDER — METFORMIN HCL ER 500 MG PO TB24
ORAL_TABLET | ORAL | 0 refills | Status: AC
  Filled 2021-11-26: qty 180, 90d supply, fill #0

## 2021-12-22 ENCOUNTER — Other Ambulatory Visit (HOSPITAL_COMMUNITY): Payer: Self-pay

## 2021-12-22 MED ORDER — ROSUVASTATIN CALCIUM 5 MG PO TABS
2.5000 mg | ORAL_TABLET | ORAL | 3 refills | Status: DC
  Filled 2021-12-22: qty 13, 91d supply, fill #0
  Filled 2022-03-23: qty 13, 91d supply, fill #1
  Filled 2022-06-19: qty 13, 91d supply, fill #2
  Filled 2022-09-28: qty 13, 91d supply, fill #3

## 2022-02-18 ENCOUNTER — Other Ambulatory Visit (HOSPITAL_COMMUNITY): Payer: Self-pay

## 2022-02-19 ENCOUNTER — Other Ambulatory Visit (HOSPITAL_COMMUNITY): Payer: Self-pay

## 2022-02-28 ENCOUNTER — Other Ambulatory Visit (HOSPITAL_COMMUNITY): Payer: Self-pay

## 2022-03-02 ENCOUNTER — Other Ambulatory Visit (HOSPITAL_COMMUNITY): Payer: Self-pay

## 2022-03-14 ENCOUNTER — Other Ambulatory Visit (HOSPITAL_COMMUNITY): Payer: Self-pay

## 2022-03-24 ENCOUNTER — Other Ambulatory Visit: Payer: Self-pay

## 2022-04-01 ENCOUNTER — Other Ambulatory Visit (HOSPITAL_COMMUNITY): Payer: Self-pay

## 2022-04-01 DIAGNOSIS — Z23 Encounter for immunization: Secondary | ICD-10-CM | POA: Diagnosis not present

## 2022-04-01 DIAGNOSIS — I10 Essential (primary) hypertension: Secondary | ICD-10-CM | POA: Diagnosis not present

## 2022-04-01 DIAGNOSIS — E7800 Pure hypercholesterolemia, unspecified: Secondary | ICD-10-CM | POA: Diagnosis not present

## 2022-04-01 DIAGNOSIS — E559 Vitamin D deficiency, unspecified: Secondary | ICD-10-CM | POA: Diagnosis not present

## 2022-04-01 DIAGNOSIS — Z0000 Encounter for general adult medical examination without abnormal findings: Secondary | ICD-10-CM | POA: Diagnosis not present

## 2022-04-01 DIAGNOSIS — E119 Type 2 diabetes mellitus without complications: Secondary | ICD-10-CM | POA: Diagnosis not present

## 2022-04-01 DIAGNOSIS — J4520 Mild intermittent asthma, uncomplicated: Secondary | ICD-10-CM | POA: Diagnosis not present

## 2022-04-01 MED ORDER — TRAZODONE HCL 50 MG PO TABS
50.0000 mg | ORAL_TABLET | Freq: Every day | ORAL | 0 refills | Status: AC
  Filled 2022-04-01: qty 90, 90d supply, fill #0

## 2022-04-02 DIAGNOSIS — M25562 Pain in left knee: Secondary | ICD-10-CM | POA: Diagnosis not present

## 2022-04-15 ENCOUNTER — Other Ambulatory Visit (HOSPITAL_COMMUNITY): Payer: Self-pay

## 2022-04-15 ENCOUNTER — Other Ambulatory Visit: Payer: Self-pay

## 2022-04-15 MED ORDER — LOSARTAN POTASSIUM 50 MG PO TABS
50.0000 mg | ORAL_TABLET | Freq: Every day | ORAL | 1 refills | Status: DC
  Filled 2022-04-15: qty 90, 90d supply, fill #0
  Filled 2022-07-01 – 2022-07-08 (×2): qty 90, 90d supply, fill #1

## 2022-05-13 DIAGNOSIS — E119 Type 2 diabetes mellitus without complications: Secondary | ICD-10-CM | POA: Diagnosis not present

## 2022-05-17 ENCOUNTER — Other Ambulatory Visit (HOSPITAL_COMMUNITY): Payer: Self-pay

## 2022-05-18 ENCOUNTER — Other Ambulatory Visit (HOSPITAL_COMMUNITY): Payer: Self-pay

## 2022-05-18 MED ORDER — ONETOUCH DELICA PLUS LANCET33G MISC
3 refills | Status: DC
  Filled 2022-05-18: qty 100, 90d supply, fill #0
  Filled 2022-08-17: qty 100, 90d supply, fill #1
  Filled 2022-11-28: qty 100, 90d supply, fill #2
  Filled 2023-03-02: qty 100, 90d supply, fill #3

## 2022-05-29 ENCOUNTER — Other Ambulatory Visit (HOSPITAL_COMMUNITY): Payer: Self-pay

## 2022-05-29 MED ORDER — ONETOUCH DELICA LANCING DEV MISC: 3 refills | Status: AC

## 2022-05-30 ENCOUNTER — Other Ambulatory Visit (HOSPITAL_COMMUNITY): Payer: Self-pay

## 2022-06-05 ENCOUNTER — Other Ambulatory Visit (HOSPITAL_COMMUNITY): Payer: Self-pay

## 2022-06-05 MED ORDER — BLOOD GLUCOSE MONITOR KIT
PACK | 0 refills | Status: AC
  Filled 2022-06-05: qty 1, 30d supply, fill #0

## 2022-06-16 ENCOUNTER — Other Ambulatory Visit (HOSPITAL_COMMUNITY): Payer: Self-pay

## 2022-06-16 ENCOUNTER — Other Ambulatory Visit: Payer: Self-pay

## 2022-06-16 MED ORDER — METFORMIN HCL ER 500 MG PO TB24
ORAL_TABLET | ORAL | 0 refills | Status: AC
  Filled 2022-06-16: qty 270, 90d supply, fill #0

## 2022-06-19 ENCOUNTER — Other Ambulatory Visit (HOSPITAL_COMMUNITY): Payer: Self-pay

## 2022-07-01 ENCOUNTER — Other Ambulatory Visit (HOSPITAL_COMMUNITY): Payer: Self-pay

## 2022-07-08 ENCOUNTER — Other Ambulatory Visit (HOSPITAL_COMMUNITY): Payer: Self-pay

## 2022-08-05 ENCOUNTER — Other Ambulatory Visit: Payer: Self-pay

## 2022-08-05 ENCOUNTER — Other Ambulatory Visit (HOSPITAL_COMMUNITY): Payer: Self-pay

## 2022-08-05 DIAGNOSIS — M25562 Pain in left knee: Secondary | ICD-10-CM | POA: Diagnosis not present

## 2022-08-05 MED ORDER — TRAMADOL HCL 50 MG PO TABS
50.0000 mg | ORAL_TABLET | Freq: Four times a day (QID) | ORAL | 0 refills | Status: AC | PRN
  Filled 2022-08-05: qty 40, 10d supply, fill #0

## 2022-08-06 ENCOUNTER — Other Ambulatory Visit (HOSPITAL_BASED_OUTPATIENT_CLINIC_OR_DEPARTMENT_OTHER): Payer: Self-pay

## 2022-08-06 ENCOUNTER — Other Ambulatory Visit (HOSPITAL_COMMUNITY): Payer: Self-pay

## 2022-08-14 ENCOUNTER — Other Ambulatory Visit (HOSPITAL_COMMUNITY): Payer: Self-pay

## 2022-08-14 DIAGNOSIS — M25562 Pain in left knee: Secondary | ICD-10-CM | POA: Diagnosis not present

## 2022-08-14 MED ORDER — MELOXICAM 7.5 MG PO TABS
7.5000 mg | ORAL_TABLET | Freq: Every day | ORAL | 0 refills | Status: AC
  Filled 2022-08-14: qty 10, 10d supply, fill #0

## 2022-08-17 ENCOUNTER — Other Ambulatory Visit (HOSPITAL_COMMUNITY): Payer: Self-pay

## 2022-08-17 DIAGNOSIS — M25562 Pain in left knee: Secondary | ICD-10-CM | POA: Diagnosis not present

## 2022-08-18 ENCOUNTER — Other Ambulatory Visit: Payer: Self-pay

## 2022-08-18 ENCOUNTER — Other Ambulatory Visit (HOSPITAL_COMMUNITY): Payer: Self-pay

## 2022-08-18 MED ORDER — GLUCOSE BLOOD VI STRP
ORAL_STRIP | 3 refills | Status: DC
  Filled 2022-08-18 (×2): qty 100, 90d supply, fill #0
  Filled 2022-11-28: qty 100, 90d supply, fill #1
  Filled 2023-03-02: qty 100, 90d supply, fill #2
  Filled 2023-06-04: qty 100, 90d supply, fill #3

## 2022-08-21 ENCOUNTER — Other Ambulatory Visit (HOSPITAL_COMMUNITY): Payer: Self-pay

## 2022-08-21 DIAGNOSIS — M1712 Unilateral primary osteoarthritis, left knee: Secondary | ICD-10-CM | POA: Diagnosis not present

## 2022-08-21 MED ORDER — PREDNISONE 10 MG PO TABS
ORAL_TABLET | ORAL | 0 refills | Status: AC
  Filled 2022-08-21: qty 20, 8d supply, fill #0

## 2022-09-14 ENCOUNTER — Other Ambulatory Visit (HOSPITAL_COMMUNITY): Payer: Self-pay

## 2022-09-14 MED ORDER — METFORMIN HCL ER 500 MG PO TB24
ORAL_TABLET | ORAL | 0 refills | Status: DC
  Filled 2022-09-14: qty 270, 90d supply, fill #0

## 2022-09-28 ENCOUNTER — Other Ambulatory Visit (HOSPITAL_COMMUNITY): Payer: Self-pay

## 2022-09-28 ENCOUNTER — Encounter (HOSPITAL_COMMUNITY): Payer: Self-pay | Admitting: Pharmacist

## 2022-09-28 DIAGNOSIS — M1712 Unilateral primary osteoarthritis, left knee: Secondary | ICD-10-CM | POA: Diagnosis not present

## 2022-09-28 MED ORDER — TRAMADOL HCL 50 MG PO TABS
50.0000 mg | ORAL_TABLET | Freq: Every evening | ORAL | 0 refills | Status: AC | PRN
  Filled 2022-09-28: qty 30, 30d supply, fill #0

## 2022-09-29 ENCOUNTER — Other Ambulatory Visit (HOSPITAL_COMMUNITY): Payer: Self-pay

## 2022-09-29 ENCOUNTER — Other Ambulatory Visit: Payer: Self-pay

## 2022-09-30 ENCOUNTER — Other Ambulatory Visit (HOSPITAL_COMMUNITY): Payer: Self-pay

## 2022-10-06 ENCOUNTER — Other Ambulatory Visit (HOSPITAL_COMMUNITY): Payer: Self-pay

## 2022-10-06 ENCOUNTER — Other Ambulatory Visit: Payer: Self-pay

## 2022-10-06 DIAGNOSIS — R61 Generalized hyperhidrosis: Secondary | ICD-10-CM | POA: Diagnosis not present

## 2022-10-06 DIAGNOSIS — I10 Essential (primary) hypertension: Secondary | ICD-10-CM | POA: Diagnosis not present

## 2022-10-06 DIAGNOSIS — E119 Type 2 diabetes mellitus without complications: Secondary | ICD-10-CM | POA: Diagnosis not present

## 2022-10-06 DIAGNOSIS — Z1231 Encounter for screening mammogram for malignant neoplasm of breast: Secondary | ICD-10-CM | POA: Diagnosis not present

## 2022-10-06 DIAGNOSIS — J4520 Mild intermittent asthma, uncomplicated: Secondary | ICD-10-CM | POA: Diagnosis not present

## 2022-10-06 MED ORDER — ALBUTEROL SULFATE HFA 108 (90 BASE) MCG/ACT IN AERS
2.0000 | INHALATION_SPRAY | RESPIRATORY_TRACT | 3 refills | Status: AC
  Filled 2022-10-06: qty 6.7, 17d supply, fill #0
  Filled 2023-03-02: qty 6.7, 17d supply, fill #1

## 2022-10-06 MED ORDER — LOSARTAN POTASSIUM 50 MG PO TABS
50.0000 mg | ORAL_TABLET | Freq: Every day | ORAL | 3 refills | Status: AC
  Filled 2022-10-06: qty 90, 90d supply, fill #0
  Filled 2022-12-30: qty 90, 90d supply, fill #1
  Filled 2023-04-02 – 2023-04-06 (×2): qty 90, 90d supply, fill #2
  Filled 2023-06-29: qty 90, 90d supply, fill #3

## 2022-11-18 DIAGNOSIS — M1712 Unilateral primary osteoarthritis, left knee: Secondary | ICD-10-CM | POA: Diagnosis not present

## 2022-11-18 DIAGNOSIS — M25662 Stiffness of left knee, not elsewhere classified: Secondary | ICD-10-CM | POA: Diagnosis not present

## 2022-11-18 DIAGNOSIS — M25562 Pain in left knee: Secondary | ICD-10-CM | POA: Diagnosis not present

## 2022-11-27 ENCOUNTER — Other Ambulatory Visit (HOSPITAL_COMMUNITY): Payer: Self-pay

## 2022-11-27 ENCOUNTER — Other Ambulatory Visit: Payer: Self-pay

## 2022-11-27 MED ORDER — ASPIRIN 81 MG PO CHEW
81.0000 mg | CHEWABLE_TABLET | Freq: Two times a day (BID) | ORAL | 0 refills | Status: AC
  Filled 2022-11-27: qty 60, 30d supply, fill #0

## 2022-11-27 MED ORDER — TRANEXAMIC ACID 650 MG PO TABS
1950.0000 mg | ORAL_TABLET | Freq: Every day | ORAL | 0 refills | Status: AC
  Filled 2022-11-27: qty 9, 3d supply, fill #0

## 2022-11-27 MED ORDER — METHOCARBAMOL 500 MG PO TABS
500.0000 mg | ORAL_TABLET | Freq: Four times a day (QID) | ORAL | 2 refills | Status: AC | PRN
  Filled 2022-11-27: qty 40, 10d supply, fill #0

## 2022-11-27 MED ORDER — POLYETHYLENE GLYCOL 3350 17 GM/SCOOP PO POWD
17.0000 g | Freq: Two times a day (BID) | ORAL | 0 refills | Status: AC
  Filled 2022-11-27 – 2022-12-01 (×2): qty 476, 14d supply, fill #0

## 2022-11-27 MED ORDER — SENNOSIDES 8.6 MG PO TABS
2.0000 | ORAL_TABLET | Freq: Every evening | ORAL | 0 refills | Status: AC
  Filled 2022-11-27: qty 28, 14d supply, fill #0

## 2022-11-27 MED ORDER — CELECOXIB 200 MG PO CAPS
200.0000 mg | ORAL_CAPSULE | Freq: Two times a day (BID) | ORAL | 0 refills | Status: AC
  Filled 2022-11-27: qty 60, 30d supply, fill #0

## 2022-11-27 MED ORDER — ONDANSETRON 4 MG PO TBDP
4.0000 mg | ORAL_TABLET | Freq: Four times a day (QID) | ORAL | 0 refills | Status: AC | PRN
  Filled 2022-11-27: qty 10, 3d supply, fill #0

## 2022-11-27 MED ORDER — OXYCODONE HCL 5 MG PO TABS
5.0000 mg | ORAL_TABLET | ORAL | 0 refills | Status: AC | PRN
  Filled 2022-11-27: qty 42, 7d supply, fill #0

## 2022-11-28 ENCOUNTER — Other Ambulatory Visit (HOSPITAL_COMMUNITY): Payer: Self-pay

## 2022-11-30 ENCOUNTER — Other Ambulatory Visit (HOSPITAL_COMMUNITY): Payer: Self-pay

## 2022-11-30 DIAGNOSIS — M1712 Unilateral primary osteoarthritis, left knee: Secondary | ICD-10-CM | POA: Diagnosis not present

## 2022-12-01 ENCOUNTER — Other Ambulatory Visit (HOSPITAL_COMMUNITY): Payer: Self-pay

## 2022-12-02 ENCOUNTER — Other Ambulatory Visit (HOSPITAL_COMMUNITY): Payer: Self-pay

## 2022-12-03 DIAGNOSIS — M25562 Pain in left knee: Secondary | ICD-10-CM | POA: Diagnosis not present

## 2022-12-03 DIAGNOSIS — M25662 Stiffness of left knee, not elsewhere classified: Secondary | ICD-10-CM | POA: Diagnosis not present

## 2022-12-08 ENCOUNTER — Other Ambulatory Visit (HOSPITAL_COMMUNITY): Payer: Self-pay

## 2022-12-08 ENCOUNTER — Other Ambulatory Visit: Payer: Self-pay

## 2022-12-08 DIAGNOSIS — M1712 Unilateral primary osteoarthritis, left knee: Secondary | ICD-10-CM | POA: Diagnosis not present

## 2022-12-08 DIAGNOSIS — M25562 Pain in left knee: Secondary | ICD-10-CM | POA: Diagnosis not present

## 2022-12-08 MED ORDER — METHOCARBAMOL 500 MG PO TABS
500.0000 mg | ORAL_TABLET | Freq: Four times a day (QID) | ORAL | 0 refills | Status: AC | PRN
  Filled 2022-12-08: qty 40, 10d supply, fill #0

## 2022-12-09 ENCOUNTER — Other Ambulatory Visit (HOSPITAL_COMMUNITY): Payer: Self-pay

## 2022-12-09 ENCOUNTER — Other Ambulatory Visit: Payer: Self-pay

## 2022-12-09 MED ORDER — METFORMIN HCL ER 500 MG PO TB24
ORAL_TABLET | ORAL | 0 refills | Status: AC
  Filled 2022-12-09: qty 270, 90d supply, fill #0

## 2022-12-10 ENCOUNTER — Encounter (HOSPITAL_COMMUNITY): Payer: Self-pay

## 2022-12-10 ENCOUNTER — Other Ambulatory Visit (HOSPITAL_COMMUNITY): Payer: Self-pay

## 2022-12-10 ENCOUNTER — Other Ambulatory Visit: Payer: Self-pay

## 2022-12-10 DIAGNOSIS — M25562 Pain in left knee: Secondary | ICD-10-CM | POA: Diagnosis not present

## 2022-12-10 MED ORDER — OXYCODONE HCL 5 MG PO TABS
5.0000 mg | ORAL_TABLET | ORAL | 0 refills | Status: AC | PRN
  Filled 2022-12-10: qty 42, 7d supply, fill #0

## 2022-12-14 DIAGNOSIS — M25562 Pain in left knee: Secondary | ICD-10-CM | POA: Diagnosis not present

## 2022-12-16 DIAGNOSIS — M25562 Pain in left knee: Secondary | ICD-10-CM | POA: Diagnosis not present

## 2022-12-22 DIAGNOSIS — M25562 Pain in left knee: Secondary | ICD-10-CM | POA: Diagnosis not present

## 2022-12-24 DIAGNOSIS — M1712 Unilateral primary osteoarthritis, left knee: Secondary | ICD-10-CM | POA: Diagnosis not present

## 2022-12-24 DIAGNOSIS — M25562 Pain in left knee: Secondary | ICD-10-CM | POA: Diagnosis not present

## 2022-12-29 DIAGNOSIS — M1712 Unilateral primary osteoarthritis, left knee: Secondary | ICD-10-CM | POA: Diagnosis not present

## 2022-12-29 DIAGNOSIS — M25562 Pain in left knee: Secondary | ICD-10-CM | POA: Diagnosis not present

## 2022-12-30 ENCOUNTER — Other Ambulatory Visit (HOSPITAL_COMMUNITY): Payer: Self-pay

## 2022-12-31 ENCOUNTER — Other Ambulatory Visit (HOSPITAL_COMMUNITY): Payer: Self-pay

## 2022-12-31 ENCOUNTER — Other Ambulatory Visit: Payer: Self-pay

## 2022-12-31 DIAGNOSIS — M25562 Pain in left knee: Secondary | ICD-10-CM | POA: Diagnosis not present

## 2022-12-31 MED ORDER — ROSUVASTATIN CALCIUM 5 MG PO TABS
2.5000 mg | ORAL_TABLET | ORAL | 3 refills | Status: AC
  Filled 2022-12-31: qty 13, 91d supply, fill #0
  Filled 2023-04-02 – 2023-04-06 (×2): qty 13, 91d supply, fill #1
  Filled 2023-06-29: qty 13, 91d supply, fill #2

## 2023-01-04 DIAGNOSIS — M25562 Pain in left knee: Secondary | ICD-10-CM | POA: Diagnosis not present

## 2023-01-04 DIAGNOSIS — M1712 Unilateral primary osteoarthritis, left knee: Secondary | ICD-10-CM | POA: Diagnosis not present

## 2023-01-06 DIAGNOSIS — M25562 Pain in left knee: Secondary | ICD-10-CM | POA: Diagnosis not present

## 2023-01-12 DIAGNOSIS — M25562 Pain in left knee: Secondary | ICD-10-CM | POA: Diagnosis not present

## 2023-01-12 DIAGNOSIS — M1712 Unilateral primary osteoarthritis, left knee: Secondary | ICD-10-CM | POA: Diagnosis not present

## 2023-01-22 ENCOUNTER — Other Ambulatory Visit: Payer: Self-pay

## 2023-01-22 ENCOUNTER — Other Ambulatory Visit (HOSPITAL_COMMUNITY): Payer: Self-pay

## 2023-01-22 DIAGNOSIS — Z5189 Encounter for other specified aftercare: Secondary | ICD-10-CM | POA: Diagnosis not present

## 2023-01-22 MED ORDER — AMOXICILLIN 500 MG PO TABS
1000.0000 mg | ORAL_TABLET | ORAL | 2 refills | Status: AC
  Filled 2023-01-22: qty 4, 1d supply, fill #0

## 2023-02-09 ENCOUNTER — Other Ambulatory Visit (HOSPITAL_COMMUNITY): Payer: Self-pay

## 2023-02-11 ENCOUNTER — Other Ambulatory Visit: Payer: Self-pay

## 2023-02-11 ENCOUNTER — Other Ambulatory Visit (HOSPITAL_COMMUNITY): Payer: Self-pay

## 2023-02-11 MED ORDER — METHOCARBAMOL 500 MG PO TABS
500.0000 mg | ORAL_TABLET | Freq: Four times a day (QID) | ORAL | 1 refills | Status: AC | PRN
  Filled 2023-02-11: qty 40, 10d supply, fill #0

## 2023-02-24 DIAGNOSIS — Z20822 Contact with and (suspected) exposure to covid-19: Secondary | ICD-10-CM | POA: Diagnosis not present

## 2023-03-02 ENCOUNTER — Other Ambulatory Visit (HOSPITAL_COMMUNITY): Payer: Self-pay

## 2023-03-25 ENCOUNTER — Other Ambulatory Visit (HOSPITAL_COMMUNITY): Payer: Self-pay

## 2023-03-25 ENCOUNTER — Other Ambulatory Visit: Payer: Self-pay

## 2023-03-25 MED ORDER — METFORMIN HCL ER 500 MG PO TB24
ORAL_TABLET | ORAL | 0 refills | Status: DC
  Filled 2023-03-25: qty 270, 90d supply, fill #0

## 2023-03-30 ENCOUNTER — Other Ambulatory Visit (HOSPITAL_COMMUNITY): Payer: Self-pay

## 2023-04-02 ENCOUNTER — Other Ambulatory Visit (HOSPITAL_COMMUNITY): Payer: Self-pay

## 2023-04-06 ENCOUNTER — Other Ambulatory Visit (HOSPITAL_COMMUNITY): Payer: Self-pay

## 2023-04-07 ENCOUNTER — Other Ambulatory Visit (HOSPITAL_COMMUNITY): Payer: Self-pay

## 2023-04-16 DIAGNOSIS — Z0000 Encounter for general adult medical examination without abnormal findings: Secondary | ICD-10-CM | POA: Diagnosis not present

## 2023-04-16 DIAGNOSIS — I10 Essential (primary) hypertension: Secondary | ICD-10-CM | POA: Diagnosis not present

## 2023-04-16 DIAGNOSIS — E119 Type 2 diabetes mellitus without complications: Secondary | ICD-10-CM | POA: Diagnosis not present

## 2023-04-16 DIAGNOSIS — E7800 Pure hypercholesterolemia, unspecified: Secondary | ICD-10-CM | POA: Diagnosis not present

## 2023-04-16 DIAGNOSIS — E559 Vitamin D deficiency, unspecified: Secondary | ICD-10-CM | POA: Diagnosis not present

## 2023-04-16 DIAGNOSIS — J4520 Mild intermittent asthma, uncomplicated: Secondary | ICD-10-CM | POA: Diagnosis not present

## 2023-05-13 ENCOUNTER — Other Ambulatory Visit (HOSPITAL_COMMUNITY): Payer: Self-pay

## 2023-05-13 MED ORDER — ROSUVASTATIN CALCIUM 5 MG PO TABS
2.5000 mg | ORAL_TABLET | ORAL | 3 refills | Status: AC
  Filled 2023-05-13: qty 13, 91d supply, fill #0

## 2023-05-18 ENCOUNTER — Other Ambulatory Visit (HOSPITAL_COMMUNITY): Payer: Self-pay

## 2023-05-26 ENCOUNTER — Ambulatory Visit: Admitting: Podiatry

## 2023-05-26 ENCOUNTER — Encounter: Payer: Self-pay | Admitting: Podiatry

## 2023-05-26 ENCOUNTER — Other Ambulatory Visit: Payer: Self-pay

## 2023-05-26 ENCOUNTER — Other Ambulatory Visit (HOSPITAL_COMMUNITY): Payer: Self-pay

## 2023-05-26 VITALS — Ht 62.0 in | Wt 178.0 lb

## 2023-05-26 DIAGNOSIS — M2012 Hallux valgus (acquired), left foot: Secondary | ICD-10-CM | POA: Diagnosis not present

## 2023-05-26 DIAGNOSIS — M2011 Hallux valgus (acquired), right foot: Secondary | ICD-10-CM

## 2023-05-26 DIAGNOSIS — M2042 Other hammer toe(s) (acquired), left foot: Secondary | ICD-10-CM | POA: Diagnosis not present

## 2023-05-26 DIAGNOSIS — E119 Type 2 diabetes mellitus without complications: Secondary | ICD-10-CM

## 2023-05-26 DIAGNOSIS — M2041 Other hammer toe(s) (acquired), right foot: Secondary | ICD-10-CM

## 2023-05-26 DIAGNOSIS — B353 Tinea pedis: Secondary | ICD-10-CM

## 2023-05-26 MED ORDER — KETOCONAZOLE 2 % EX CREA
TOPICAL_CREAM | CUTANEOUS | 1 refills | Status: AC
  Filled 2023-05-26: qty 60, 30d supply, fill #0
  Filled 2023-09-25 – 2023-09-29 (×2): qty 60, 30d supply, fill #1

## 2023-05-26 MED ORDER — LANCING DEVICE MISC
0 refills | Status: AC
  Filled 2023-05-26: qty 1, 90d supply, fill #0

## 2023-05-26 NOTE — Progress Notes (Signed)
 ANNUAL DIABETIC FOOT EXAM  Subjective: Vanessa Dyer presents today for annual diabetic foot exam.  Chief Complaint  Patient presents with   Nail Problem    Pt is here for [facility] last A1C was 6.5 PCP is Dr Donia Furlough and LOV was in April.   Patient confirms h/o diabetes.  Patient denies any h/o foot wounds.  Vanessa Byers, MD is patient's PCP.  Past Medical History:  Diagnosis Date   Asthma    Diabetes mellitus without complication (HCC)    Hyperlipidemia    Hypertension    Prediabetes    Patient Active Problem List   Diagnosis Date Noted   Osteoarthritis of right knee 12/28/2017   Pain in right knee 12/28/2017   Type 2 diabetes mellitus (HCC) 11/28/2015   Impaired fasting glucose 02/08/2015   Essential hypertension 02/07/2015   Past Surgical History:  Procedure Laterality Date   ABDOMINAL HYSTERECTOMY     CESAREAN SECTION     Current Outpatient Medications on File Prior to Visit  Medication Sig Dispense Refill   acetaminophen (TYLENOL) 500 MG tablet Take 500 mg by mouth every 6 (six) hours as needed.     albuterol  (PROVENTIL  HFA;VENTOLIN  HFA) 108 (90 BASE) MCG/ACT inhaler Inhale 2 puffs into the lungs every 6 (six) hours as needed for wheezing or shortness of breath. 1 Inhaler 2   albuterol  (VENTOLIN  HFA) 108 (90 Base) MCG/ACT inhaler Inhale 2 puffs into the lungs every 4 (four) hours as needed 6.7 g 3   albuterol  (VENTOLIN  HFA) 108 (90 Base) MCG/ACT inhaler Inhale 2 puffs into the lungs every 4 (four) hours. 6.7 g 3   amoxicillin  (AMOXIL ) 500 MG tablet Take 4 tablets by mouth 1 hour prior to dental work 4 tablet 2   aspirin  81 MG chewable tablet Chew 1 tablet (81 mg total) by mouth 2 (two) times daily. 60 tablet 0   aspirin  81 MG EC tablet      blood glucose meter kit and supplies KIT Use daily as directed to check blood sugar 1 each 0   Blood Glucose Monitoring Suppl (ONETOUCH VERIO) w/Device KIT Use to check blood sugar twice daily as directed. 1 kit 0    celecoxib  (CELEBREX ) 200 MG capsule Take 1 capsule (200 mg total) by mouth 2 (two) times daily with a meal. 60 capsule 0   chlorpheniramine-HYDROcodone (TUSSIONEX PENNKINETIC ER) 10-8 MG/5ML LQCR Take 5 mLs by mouth every 12 (twelve) hours. 60 mL 0   Continuous Blood Gluc Receiver (FREESTYLE LIBRE 14 DAY READER) DEVI Use as directed 1 each 0   diclofenac Sodium (VOLTAREN) 1 % GEL diclofenac 1 % topical gel  APPLY 2 GRAMS TO THE AFFECTED AREA(S) BY TOPICAL ROUTE 4 TIMES PER DAY     glucose blood (PRECISION QID TEST) test strip USE TO TEST YOUR BLOOD SUGAR ONCE A DAY     glucose blood test strip Use daily as directed 100 each 3   Lancet Devices (ONE TOUCH DELICA LANCING DEV) MISC Use once a day as directed 90 each 3   Lancets (ONETOUCH DELICA PLUS LANCET33G) MISC Use once daily as directed 100 each 3   losartan  (COZAAR ) 50 MG tablet losartan  50 mg tablet     losartan  (COZAAR ) 50 MG tablet Take 1 tablet (50 mg total) by mouth daily. 90 tablet 1   losartan  (COZAAR ) 50 MG tablet Take 1 tablet (50 mg total) by mouth daily. 90 tablet 3   meloxicam  (MOBIC ) 7.5 MG tablet Take 1 tablet (  7.5 mg total) by mouth daily. 10 tablet 0   metFORMIN  (GLUCOPHAGE -XR) 500 MG 24 hr tablet Take 1 tablet by mouth once a day. 90 tablet 3   metFORMIN  (GLUCOPHAGE -XR) 500 MG 24 hr tablet Take 1 tablet by mouth with breakfast and 2 tablets with dinner 270 tablet 3   metFORMIN  (GLUCOPHAGE -XR) 500 MG 24 hr tablet Take 1 tablet by mouth with breakfast and dinner. 180 tablet 0   metFORMIN  (GLUCOPHAGE -XR) 500 MG 24 hr tablet Take 1 tablet (500 mg total) by mouth daily with breakfast AND 2 tablets (1,000 mg total) every evening with dinner. 270 tablet 0   metFORMIN  (GLUCOPHAGE -XR) 500 MG 24 hr tablet Take 1 tablet (500 mg total) by mouth daily with breakfast AND 2 tablets (1,000 mg total) daily with supper. 270 tablet 0   metFORMIN  (GLUCOPHAGE -XR) 500 MG 24 hr tablet Take 1 tablet (500 mg total) by mouth daily with breakfast AND 2  tablets (1,000 mg total) daily with supper. 270 tablet 0   methocarbamol  (ROBAXIN ) 500 MG tablet Take 1 tablet (500 mg total) by mouth every 6 (six) hours as needed for muscle spasm 40 tablet 2   methocarbamol  (ROBAXIN ) 500 MG tablet Take 1 tablet (500 mg total) by mouth every 6 (six) hours as needed for muscle spasm 40 tablet 0   methocarbamol  (ROBAXIN ) 500 MG tablet Take 1 tablet (500 mg total) by mouth every 6 (six) hours as needed for muscle spasm. 40 tablet 1   Multiple Vitamins-Minerals (MULTIVITAMIN GUMMIES ADULT PO) Take 1 tablet by mouth daily.     ondansetron  (ZOFRAN -ODT) 4 MG disintegrating tablet Take 1 tablet (4 mg total) by mouth every 6 (six) hours as needed for nausea/vomiting 10 tablet 0   OneTouch Delica Lancets 33G MISC Use to check blood sugar once daily. 100 each 3   oxyCODONE  (OXY IR/ROXICODONE ) 5 MG immediate release tablet Take 1 tablet by mouth every 4 hours as needed for severe pain 42 tablet 0   oxyCODONE  (OXY IR/ROXICODONE ) 5 MG immediate release tablet Take 1 tablet by mouth every 4 hours as needed for severe post op pain 42 tablet 0   polyethylene glycol powder (GLYCOLAX /MIRALAX ) 17 GM/SCOOP powder Mix 17 g (1 capful) as directed with 4-8 ounces of liquid and take by mouth 2 (two) times daily. 476 g 0   rosuvastatin  (CRESTOR ) 5 MG tablet Take 0.5 tablets (2.5 mg total) by mouth 2 (two) times a week. 13 tablet 3   rosuvastatin  (CRESTOR ) 5 MG tablet Take 0.5 tablets (2.5 mg total) by mouth 2 (two) times a week. 13 tablet 3   terbinafine (LAMISIL AT) 1 % cream Lamisil AT 1 % topical cream  APPLY TO THE AFFECTED AND SURROUNDING AREAS OF SKIN BY TOPICAL ROUTE ONCE DAILY     traMADol  (ULTRAM ) 50 MG tablet Take 1 tablet (50 mg total) by mouth every 6 (six) hours as needed. 40 tablet 0   traMADol  (ULTRAM ) 50 MG tablet Take 1 tablet (50 mg total) by mouth at bedtime as needed for pain. 30 tablet 0   traZODone  (DESYREL ) 50 MG tablet Take 1 tablet (50 mg total) by mouth at bedtime  as needed 90 tablet 0   valACYclovir  (VALTREX ) 500 MG tablet Take 1 tablet by mouth twice a day as needed. 30 tablet 1   albuterol  (VENTOLIN  HFA) 108 (90 Base) MCG/ACT inhaler INHALE 2 PUFFS INTO THE LUNGS EVERY 4 HOURS AS NEEDED 8.5 g 5   losartan  (COZAAR ) 50 MG tablet TAKE 1  TABLET BY MOUTH ONCE A DAY 90 tablet 3   rosuvastatin  (CRESTOR ) 5 MG tablet TAKE 1/2 TABLET BY MOUTH TWICE A WEEK 13 tablet 2   No current facility-administered medications on file prior to visit.    No Known Allergies Social History   Occupational History   Not on file  Tobacco Use   Smoking status: Never   Smokeless tobacco: Never  Substance and Sexual Activity   Alcohol use: Yes   Drug use: No   Sexual activity: Not on file   Family History  Problem Relation Age of Onset   Hypertension Mother    Hypertension Father    Heart disease Father    Hypertension Sister    Diabetes Brother    Immunization History  Administered Date(s) Administered   Zoster Recombinant(Shingrix) 03/21/2019     Review of Systems: Negative except as noted in the HPI.   Objective: There were no vitals filed for this visit.  Vanessa Dyer is a pleasant 73 y.o. female in NAD. AAO X 3.  Diabetic foot exam was performed with the following findings:   Vascular Examination: Capillary refill time immediate b/l. Vascular status intact b/l with palpable pedal pulses. Pedal hair present b/l. No pain with calf compression b/l. Skin temperature gradient WNL b/l. No cyanosis or clubbing b/l. No ischemia or gangrene noted b/l.   Neurological Examination: Sensation grossly intact b/l with 10 gram monofilament. Vibratory sensation intact b/l.   Dermatological Examination: Pedal skin with normal turgor, texture and tone b/l.  No open wounds.   Interdigital maceration noted webspaces 1-4 b/l with no breaks in skin, no open wounds.   Toenails 1-5 b/l thick, discolored, elongated with subungual debris and pain on dorsal palpation.    No corns, calluses nor porokeratotic lesions noted.  Musculoskeletal Examination: Muscle strength 5/5 to all lower extremity muscle groups bilaterally. No pain, crepitus or joint limitation noted with ROM bilateral LE. No gross bony deformities bilaterally.  Radiographs: None     Lab Results  Component Value Date   HGBA1C 6.5 07/15/2015   ADA Risk Categorization: Low Risk :  Patient has all of the following: Intact protective sensation No prior foot ulcer  No severe deformity Pedal pulses present  Assessment: 1. Tinea pedis of both feet   2. Hallux valgus, acquired, bilateral   3. Acquired hammertoes of both feet   4. Type 2 diabetes mellitus without complication, without long-term current use of insulin (HCC)   5. Encounter for diabetic foot exam (HCC)     Plan: Diabetic foot examination performed today. All patient's and/or POA's questions/concerns addressed on today's visit. Toenails 1-5 debrided in length and girth without incident. Continue foot and shoe inspections daily. Monitor blood glucose per PCP/Endocrinologist's recommendations. Continue soft, supportive shoe gear daily. Report any pedal injuries to medical professional. Call office if there are any questions/concerns. -Discussed tinea pedis infection. To prevent re-infection of tinea pedis, patient/POA/caregiver instructed to spray shoes with Lysol every evening and clean tub/shower with bleach based cleanser. Rx sent to pharmacy for Ketoconazole Cream 2% to be applied to both feet and between toes once daily for six weeks. After Ketoconazole Cream completed, start Lotrimin Spray powder between toes once daily. -Patient/POA to call should there be question/concern in the interim. Return in about 1 year (around 05/25/2024), or if symptoms worsen or fail to improve.  Vanessa Dyer, DPM      [place] LOCATION: [address]  [place], [place] [address]                    Office [phone]   [facility] LOCATION: [address] [place], [place] [address] Office [phone]

## 2023-05-26 NOTE — Patient Instructions (Addendum)
 Recommend wide shoe gear with high toe box that does not rub your bunions or hammertoes.   After you complete your course of Ketoconazole cream, start spraying between toes with Lotrimin Spray powder once daily.  To prevent reinfection of athlete's feet, spray shoes with lysol every evening.  Clean tub or shower with bleach based cleanser.  Athlete's Foot Athlete's foot (tinea pedis) is a fungal infection of the skin on your feet. It often occurs on the skin that is between or underneath the toes. It can also occur on the soles of your feet. The infection can spread from person to person (is contagious). It can also spread when a person's bare feet come in contact with the fungus on shower floors or on items such as shoes. What are the causes? This condition is caused by a fungus that grows in warm, moist places. You can get athlete's foot by sharing shoes, shower stalls, towels, and wet floors with someone who is infected. Not washing your feet or changing your socks often enough can also lead to athlete's foot. What increases the risk? This condition is more likely to develop in: Men. People who have a weak body defense system (immune system). People who have diabetes. People who use public showers, such as at a gym. People who wear heavy-duty shoes, such as Youth worker. Seasons with warm, humid weather. What are the signs or symptoms? Symptoms of this condition include: Itchy areas between your toes or on the soles of your feet. White, flaky, or scaly areas between your toes or on the soles of your feet. Very itchy small blisters between your toes or on the soles of your feet. Small cuts in your skin. These cuts can become infected. Thick or discolored toenails. How is this diagnosed? This condition may be diagnosed with a physical exam and a review of your medical history. Your health care provider may also take a skin or toenail sample to examine under a  microscope. How is this treated? This condition is treated with antifungal medicines. These may be applied as powders, ointments, or creams. In severe cases, an oral antifungal medicine may be given. Follow these instructions at home: Medicines Apply or take over-the-counter and prescription medicines only as told by your health care provider. Apply your antifungal medicine as told by your health care provider. Do not stop using the antifungal even if your condition improves. Foot care Do not scratch your feet. Keep your feet dry: Wear cotton or wool socks. Change your socks every day or if they become wet. Wear shoes that allow air to flow, such as sandals or canvas tennis shoes. Wash and dry your feet, including the area between your toes. Also, wash and dry your feet: Every day or as told by your health care provider. After exercising. General instructions Do not let others use towels, shoes, nail clippers, or other personal items that touch your feet. Protect your feet by wearing sandals in wet areas, such as locker rooms and shared showers. Keep all follow-up visits. This is important. If you have diabetes, keep your blood sugar under control. Contact a health care provider if: You have a fever. You have swelling, soreness, warmth, or redness in your foot. Your feet are not getting better with treatment. Your symptoms get worse. You have new symptoms. You have severe pain. Summary Athlete's foot (tinea pedis) is a fungal infection of the skin on your feet. It often occurs on skin that is between or  underneath the toes. This condition is caused by a fungus that grows in warm, moist places. Symptoms include white, flaky, or scaly areas between your toes or on the soles of your feet. This condition is treated with antifungal medicines. Keep your feet clean. Always dry them thoroughly. This information is not intended to replace advice given to you by your health care provider. Make  sure you discuss any questions you have with your health care provider. Document Revised: 04/28/2020 Document Reviewed: 04/28/2020 Elsevier Patient Education  2024 [employer].

## 2023-06-04 ENCOUNTER — Other Ambulatory Visit (HOSPITAL_COMMUNITY): Payer: Self-pay

## 2023-06-04 MED ORDER — ONETOUCH DELICA PLUS LANCET33G MISC
3 refills | Status: AC
  Filled 2023-06-04: qty 100, 100d supply, fill #0

## 2023-06-26 DIAGNOSIS — E119 Type 2 diabetes mellitus without complications: Secondary | ICD-10-CM | POA: Diagnosis not present

## 2023-06-29 ENCOUNTER — Other Ambulatory Visit (HOSPITAL_COMMUNITY): Payer: Self-pay

## 2023-06-30 ENCOUNTER — Other Ambulatory Visit (HOSPITAL_COMMUNITY): Payer: Self-pay

## 2023-06-30 ENCOUNTER — Other Ambulatory Visit: Payer: Self-pay

## 2023-06-30 DIAGNOSIS — M79632 Pain in left forearm: Secondary | ICD-10-CM | POA: Diagnosis not present

## 2023-06-30 MED ORDER — METFORMIN HCL ER 500 MG PO TB24
ORAL_TABLET | ORAL | 0 refills | Status: AC
  Filled 2023-06-30: qty 270, 90d supply, fill #0

## 2023-06-30 MED ORDER — MELOXICAM 7.5 MG PO TABS
7.5000 mg | ORAL_TABLET | Freq: Every day | ORAL | 0 refills | Status: AC
  Filled 2023-06-30: qty 14, 14d supply, fill #0

## 2023-07-01 ENCOUNTER — Other Ambulatory Visit: Payer: Self-pay

## 2023-07-01 ENCOUNTER — Other Ambulatory Visit (HOSPITAL_COMMUNITY): Payer: Self-pay

## 2023-07-02 ENCOUNTER — Other Ambulatory Visit: Payer: Self-pay

## 2023-07-16 DIAGNOSIS — E7800 Pure hypercholesterolemia, unspecified: Secondary | ICD-10-CM | POA: Diagnosis not present

## 2023-07-19 DIAGNOSIS — E119 Type 2 diabetes mellitus without complications: Secondary | ICD-10-CM | POA: Diagnosis not present

## 2023-07-19 DIAGNOSIS — J4520 Mild intermittent asthma, uncomplicated: Secondary | ICD-10-CM | POA: Diagnosis not present

## 2023-07-19 DIAGNOSIS — I10 Essential (primary) hypertension: Secondary | ICD-10-CM | POA: Diagnosis not present

## 2023-07-27 ENCOUNTER — Other Ambulatory Visit (HOSPITAL_COMMUNITY): Payer: Self-pay

## 2023-07-27 ENCOUNTER — Other Ambulatory Visit: Payer: Self-pay

## 2023-07-27 ENCOUNTER — Emergency Department (HOSPITAL_COMMUNITY): Admission: EM | Admit: 2023-07-27 | Discharge: 2023-07-27 | Disposition: A | Discharge status: Home / Self Care

## 2023-07-27 ENCOUNTER — Encounter (HOSPITAL_COMMUNITY): Payer: Self-pay | Admitting: *Deleted

## 2023-07-27 ENCOUNTER — Emergency Department (HOSPITAL_COMMUNITY)

## 2023-07-27 DIAGNOSIS — R1110 Vomiting, unspecified: Secondary | ICD-10-CM | POA: Diagnosis not present

## 2023-07-27 DIAGNOSIS — I6523 Occlusion and stenosis of bilateral carotid arteries: Secondary | ICD-10-CM | POA: Diagnosis not present

## 2023-07-27 DIAGNOSIS — Z7982 Long term (current) use of aspirin: Secondary | ICD-10-CM | POA: Diagnosis not present

## 2023-07-27 DIAGNOSIS — R42 Dizziness and giddiness: Secondary | ICD-10-CM | POA: Insufficient documentation

## 2023-07-27 DIAGNOSIS — E042 Nontoxic multinodular goiter: Secondary | ICD-10-CM | POA: Diagnosis not present

## 2023-07-27 DIAGNOSIS — R55 Syncope and collapse: Secondary | ICD-10-CM | POA: Diagnosis not present

## 2023-07-27 LAB — BASIC METABOLIC PANEL WITH GFR
Anion gap: 14 (ref 5–15)
BUN: 11 mg/dL (ref 8–23)
CO2: 25 mmol/L (ref 22–32)
Calcium: 9.7 mg/dL (ref 8.9–10.3)
Chloride: 103 mmol/L (ref 98–111)
Creatinine, Ser: 0.57 mg/dL (ref 0.44–1.00)
GFR, Estimated: >60 mL/min (ref >60)
Glucose, Bld: 97 mg/dL (ref 70–99)
Potassium: 4 mmol/L (ref 3.5–5.1)
Sodium: 142 mmol/L (ref 135–145)

## 2023-07-27 LAB — CBC WITH DIFFERENTIAL/PLATELET
Abs Immature Granulocytes: 0.03 K/uL (ref 0.00–0.07)
Basophils Absolute: 0.1 K/uL (ref 0.0–0.1)
Basophils Relative: 1 %
Eosinophils Absolute: 0.1 K/uL (ref 0.0–0.5)
Eosinophils Relative: 2 %
HCT: 42.2 % (ref 36.0–46.0)
Hemoglobin: 14 g/dL (ref 12.0–15.0)
Immature Granulocytes: 0 %
Lymphocytes Relative: 23 %
Lymphs Abs: 1.6 K/uL (ref 0.7–4.0)
MCH: 30.2 pg (ref 26.0–34.0)
MCHC: 33.2 g/dL (ref 30.0–36.0)
MCV: 90.9 fL (ref 80.0–100.0)
Monocytes Absolute: 0.6 K/uL (ref 0.1–1.0)
Monocytes Relative: 9 %
Neutro Abs: 4.6 K/uL (ref 1.7–7.7)
Neutrophils Relative %: 65 %
Platelets: 305 K/uL (ref 150–400)
RBC: 4.64 MIL/uL (ref 3.87–5.11)
RDW: 13.4 % (ref 11.5–15.5)
WBC: 7 K/uL (ref 4.0–10.5)
nRBC: 0 % (ref 0.0–0.2)

## 2023-07-27 LAB — TROPONIN I (HIGH SENSITIVITY)
Troponin I (High Sensitivity): <2 ng/L (ref <18)
Troponin I (High Sensitivity): <2 ng/L (ref <18)

## 2023-07-27 MED ORDER — SODIUM CHLORIDE 0.9 % IV BOLUS
500.0000 mL | Freq: Once | INTRAVENOUS | Status: AC
  Administered 2023-07-27: 500 mL via INTRAVENOUS

## 2023-07-27 MED ORDER — IOHEXOL 350 MG/ML SOLN
75.0000 mL | Freq: Once | INTRAVENOUS | Status: AC | PRN
  Administered 2023-07-27: 75 mL via INTRAVENOUS

## 2023-07-27 MED ORDER — MECLIZINE HCL 25 MG PO TABS
25.0000 mg | ORAL_TABLET | Freq: Three times a day (TID) | ORAL | 0 refills | Status: AC | PRN
  Filled 2023-07-27: qty 15, 5d supply, fill #0

## 2023-07-27 NOTE — Discharge Instructions (Signed)
 Drink lots of fluids and you can use your meclizine  as needed for dizziness.  Follow-up with your primary care doctor as needed.  Return to the ER for new or worsening symptoms.

## 2023-07-27 NOTE — ED Provider Notes (Addendum)
 [place] EMERGENCY DEPARTMENT AT [facility] Provider Note   CSN: [id number] Arrival date & time: 07/27/23  1023     Patient presents with: Dizziness   Vanessa Dyer is a 73 y.o. female.   73 year old female presents for evaluation of dizziness.  She states this started last night after she was painting the ceiling all day.  She states she got very dizzy lightheaded folic she was, faint.  States she threw up and then went to bed.  States she does not feel back to normal denies having a headache and some intermittent dizziness as well.  States he gets worse when she lays on her left side.  She has had vertigo in the past and states this feels different.  Denies any other symptoms or concerns at this time.   Dizziness Associated symptoms: vomiting   Associated symptoms: no chest pain, no palpitations and no shortness of breath        Prior to Admission medications   Medication Sig Start Date End Date Taking? Authorizing Provider  meclizine  (ANTIVERT ) 25 MG tablet Take 1 tablet (25 mg total) by mouth 3 (three) times daily as needed for up to 5 days for dizziness. 07/27/23 08/01/23 Yes Vivan Agostino L, DO  acetaminophen (TYLENOL) 500 MG tablet Take 500 mg by mouth every 6 (six) hours as needed.    [provider]  albuterol  (PROVENTIL  HFA;VENTOLIN  HFA) 108 (90 BASE) MCG/ACT inhaler Inhale 2 puffs into the lungs every 6 (six) hours as needed for wheezing or shortness of breath. 07/07/13   Joane Artist RAMAN, MD  albuterol  (VENTOLIN  HFA) 108 [phone] Base) MCG/ACT inhaler INHALE 2 PUFFS INTO THE LUNGS EVERY 4 HOURS AS NEEDED 12/12/19 12/26/20  Chrystal Lamarr RAMAN, MD  albuterol  (VENTOLIN  HFA) 108 [phone] Base) MCG/ACT inhaler Inhale 2 puffs into the lungs every 4 (four) hours as needed 10/14/21     albuterol  (VENTOLIN  HFA) 108 (90 Base) MCG/ACT inhaler Inhale 2 puffs into the lungs every 4 (four) hours. 10/06/22     amoxicillin  (AMOXIL ) 500 MG tablet Take 4 tablets by mouth 1 hour prior  to dental work 01/22/23     aspirin  81 MG chewable tablet Chew 1 tablet (81 mg total) by mouth 2 (two) times daily. 11/27/22     aspirin  81 MG EC tablet     [provider]  blood glucose meter kit and supplies KIT Use daily as directed to check blood sugar 06/05/22     Blood Glucose Monitoring Suppl (ONETOUCH VERIO) w/Device KIT Use to check blood sugar twice daily as directed. 09/30/20   Chrystal Lamarr RAMAN, MD  celecoxib  (CELEBREX ) 200 MG capsule Take 1 capsule (200 mg total) by mouth 2 (two) times daily with a meal. 11/27/22     chlorpheniramine-HYDROcodone (TUSSIONEX PENNKINETIC ER) 10-8 MG/5ML LQCR Take 5 mLs by mouth every 12 (twelve) hours. 07/09/13   Lynwood Anes, MD  Continuous Blood Gluc Receiver (FREESTYLE LIBRE 14 DAY READER) DEVI Use as directed 03/26/21     diclofenac Sodium (VOLTAREN) 1 % GEL diclofenac 1 % topical gel  APPLY 2 GRAMS TO THE AFFECTED AREA(S) BY TOPICAL ROUTE 4 TIMES PER DAY    [provider]  glucose blood (PRECISION QID TEST) test strip USE TO TEST YOUR BLOOD SUGAR ONCE A DAY 05/03/19   [provider]  glucose blood test strip Use daily as directed 08/17/22     ketoconazole  (NIZORAL ) 2 % cream Apply to both feet and between toes once daily  for 6 weeks. 05/26/23   Gaynel Delon CROME, DPM  Lancet Devices (LANCING DEVICE) MISC use to test blood sugar via finger stick once daily 05/26/23     Lancet Devices (ONE TOUCH DELICA LANCING DEV) MISC Use once a day as directed 05/29/22     Lancets (ONETOUCH DELICA PLUS LANCET33G) MISC Use as directed to check blood sugar once daily. 06/04/23     losartan  (COZAAR ) 50 MG tablet TAKE 1 TABLET BY MOUTH ONCE A DAY 03/25/20 03/25/21  Chrystal Lamarr RAMAN, MD  losartan  (COZAAR ) 50 MG tablet losartan  50 mg tablet    [provider]  losartan  (COZAAR ) 50 MG tablet Take 1 tablet (50 mg total) by mouth daily. 10/14/21     losartan  (COZAAR ) 50 MG tablet Take 1 tablet (50 mg total) by mouth daily. 10/06/22     meloxicam   (MOBIC ) 7.5 MG tablet Take 1 tablet (7.5 mg total) by mouth daily. 08/14/22     meloxicam  (MOBIC ) 7.5 MG tablet Take 1 tablet (7.5 mg total) by mouth daily. 06/30/23     metFORMIN  (GLUCOPHAGE -XR) 500 MG 24 hr tablet Take 1 tablet by mouth once a day. 10/10/20     metFORMIN  (GLUCOPHAGE -XR) 500 MG 24 hr tablet Take 1 tablet by mouth with breakfast and 2 tablets with dinner 03/26/21     metFORMIN  (GLUCOPHAGE -XR) 500 MG 24 hr tablet Take 1 tablet by mouth with breakfast and dinner. 11/26/21     metFORMIN  (GLUCOPHAGE -XR) 500 MG 24 hr tablet Take 1 tablet (500 mg total) by mouth daily with breakfast AND 2 tablets (1,000 mg total) every evening with dinner. 06/16/22     metFORMIN  (GLUCOPHAGE -XR) 500 MG 24 hr tablet Take 1 tablet (500 mg total) by mouth daily with breakfast AND 2 tablets (1,000 mg total) daily with supper. 12/09/22     metFORMIN  (GLUCOPHAGE -XR) 500 MG 24 hr tablet Take 1 tablet (500 mg total) by mouth daily with breakfast AND 2 tablets (1,000 mg total) daily with supper. 06/30/23     methocarbamol  (ROBAXIN ) 500 MG tablet Take 1 tablet (500 mg total) by mouth every 6 (six) hours as needed for muscle spasm 11/27/22     methocarbamol  (ROBAXIN ) 500 MG tablet Take 1 tablet (500 mg total) by mouth every 6 (six) hours as needed for muscle spasm 12/08/22     methocarbamol  (ROBAXIN ) 500 MG tablet Take 1 tablet (500 mg total) by mouth every 6 (six) hours as needed for muscle spasm. 02/11/23     Multiple Vitamins-Minerals (MULTIVITAMIN GUMMIES ADULT PO) Take 1 tablet by mouth daily.    [provider]  ondansetron  (ZOFRAN -ODT) 4 MG disintegrating tablet Take 1 tablet (4 mg total) by mouth every 6 (six) hours as needed for nausea/vomiting 11/27/22     OneTouch Delica Lancets 33G MISC Use to check blood sugar once daily. 09/26/20     oxyCODONE  (OXY IR/ROXICODONE ) 5 MG immediate release tablet Take 1 tablet by mouth every 4 hours as needed for severe pain 11/27/22     oxyCODONE  (OXY IR/ROXICODONE ) 5 MG  immediate release tablet Take 1 tablet by mouth every 4 hours as needed for severe post op pain 12/09/22     polyethylene glycol powder (GLYCOLAX /MIRALAX ) 17 GM/SCOOP powder Mix 17 g (1 capful) as directed with 4-8 ounces of liquid and take by mouth 2 (two) times daily. 11/27/22   Patti Rosina SAUNDERS, PA-C  rosuvastatin  (CRESTOR ) 5 MG tablet TAKE 1/2 TABLET BY MOUTH TWICE A WEEK 01/31/20 01/30/21  Chrystal Lamarr RAMAN, MD  rosuvastatin  (CRESTOR ) 5 MG tablet Take 0.5 tablets (2.5 mg total) by mouth 2 (two) times a week. 12/31/22     rosuvastatin  (CRESTOR ) 5 MG tablet Take 0.5 tablets (2.5 mg total) by mouth 2 (two) times a week. 05/13/23     terbinafine (LAMISIL AT) 1 % cream Lamisil AT 1 % topical cream  APPLY TO THE AFFECTED AND SURROUNDING AREAS OF SKIN BY TOPICAL ROUTE ONCE DAILY    [provider]  traMADol  (ULTRAM ) 50 MG tablet Take 1 tablet (50 mg total) by mouth every 6 (six) hours as needed. 08/05/22     traMADol  (ULTRAM ) 50 MG tablet Take 1 tablet (50 mg total) by mouth at bedtime as needed for pain. 09/28/22     traZODone  (DESYREL ) 50 MG tablet Take 1 tablet (50 mg total) by mouth at bedtime as needed 04/01/22     valACYclovir  (VALTREX ) 500 MG tablet Take 1 tablet by mouth twice a day as needed. 03/14/21       Allergies: Patient has no known allergies.    Review of Systems  Constitutional:  Negative for chills and fever.  HENT:  Negative for ear pain and sore throat.   Eyes:  Negative for pain and visual disturbance.  Respiratory:  Negative for cough and shortness of breath.   Cardiovascular:  Negative for chest pain and palpitations.  Gastrointestinal:  Positive for vomiting. Negative for abdominal pain.  Genitourinary:  Negative for dysuria and hematuria.  Musculoskeletal:  Negative for arthralgias and back pain.  Skin:  Negative for color change and rash.  Neurological:  Positive for dizziness. Negative for seizures and syncope.  All other systems reviewed and are  negative.   Updated Vital Signs BP (!) 157/111   Pulse 66   Temp 98 F (36.7 C) (Oral)   Resp 16   Wt 80.7 kg   SpO2 100%   BMI 32.56 kg/m   Physical Exam Vitals and nursing note reviewed.  Constitutional:      General: She is not in acute distress.    Appearance: Normal appearance. She is well-developed. She is not ill-appearing.  HENT:     Head: Normocephalic and atraumatic.  Eyes:     Conjunctiva/sclera: Conjunctivae normal.  Cardiovascular:     Rate and Rhythm: Normal rate and regular rhythm.     Heart sounds: No murmur heard. Pulmonary:     Effort: Pulmonary effort is normal. No respiratory distress.     Breath sounds: Normal breath sounds.  Abdominal:     General: There is no distension.     Palpations: Abdomen is soft. There is no mass.     Tenderness: There is no abdominal tenderness.     Hernia: No hernia is present.  Musculoskeletal:        General: No swelling.     Cervical back: Neck supple.  Skin:    General: Skin is warm and dry.     Capillary Refill: Capillary refill takes less than 2 seconds.  Neurological:     General: No focal deficit present.     Mental Status: She is alert. Mental status is at baseline.     Cranial Nerves: No cranial nerve deficit.  Psychiatric:        Mood and Affect: Mood normal.     (all labs ordered are listed, but only abnormal results are displayed) Labs Reviewed  BASIC METABOLIC PANEL WITH GFR  CBC WITH DIFFERENTIAL/PLATELET  TROPONIN I (HIGH SENSITIVITY)  TROPONIN I (HIGH SENSITIVITY)  EKG: EKG Interpretation Date/Time:  Tuesday July 27 2023 10:38:50 EDT Ventricular Rate:  68 PR Interval:  151 QRS Duration:  81 QT Interval:  387 QTC Calculation: 412 R Axis:   42  Text Interpretation: Sinus rhythm Low voltage, precordial leads Baseline wander No STEMI No previous EKG for comparison Confirmed by Gennaro Bouchard ([id number]) on 07/27/2023 11:13:34 AM  Radiology: CT Angio Head W or Wo Contrast Result Date:  07/27/2023 CLINICAL DATA:  dizziness, near syncope, vision changes EXAM: CT HEAD WITHOUT CONTRAST CT ANGIOGRAPHY OF THE HEAD AND NECK TECHNIQUE: Contiguous axial images were obtained from the base of the skull through the vertex without intravenous contrast. Multidetector CT imaging of the head and neck was performed using the standard protocol during bolus administration of intravenous contrast. Multiplanar CT image reconstructions and MIPs were obtained to evaluate the vascular anatomy. Carotid stenosis measurements (when applicable) are obtained utilizing NASCET criteria, using the distal internal carotid diameter as the denominator. RADIATION DOSE REDUCTION: This exam was performed according to the departmental dose-optimization program which includes automated exposure control, adjustment of the mA and/or kV according to patient size and/or use of iterative reconstruction technique. CONTRAST:  75mL OMNIPAQUE  IOHEXOL  350 MG/ML SOLN COMPARISON:  None Available. FINDINGS: CT HEAD Brain: Normal brain. No evidence of hemorrhage, mass, acute cortical infarct or hydrocephalus. Vascular: Atheromatous calcifications within the carotid siphons. Skull: Intact and unremarkable. Sinuses/Orbits: Clear paranasal sinuses.  Normal orbits. CTA NECK Aortic arch: Normal in caliber. Standard 3 vessel takeoff of the great arteries. Right carotid system: The common carotid artery is normal in caliber and unremarkable. There is mild to moderate calcific plaque within the origin of the internal carotid artery, with less than 20% stenosis. The internal carotid artery is mildly tortuous, but otherwise unremarkable. Left carotid system: The common carotid artery is normal in caliber and unremarkable. There is mild calcific atheromatous disease within the origin of the internal carotid artery, but no luminal stenosis. Vertebral arteries:Vertebral arteries are codominant and normal in caliber throughout the neck. Skeleton: Negative. Other  neck: Small nodules are present within the right lobe and isthmus of the thyroid . The visualized lung apices are clear. CTA HEAD Anterior circulation: Mild to moderate calcific atheromatous disease within the right carotid siphon. No flow-limiting stenosis. The anterior and middle cerebral arteries and their proximal branches are normal in caliber. No evidence of aneurysm or flow-limiting stenosis. Posterior circulation: The vertebrobasilar system is unremarkable. The cerebellar and posterior cerebral arteries are normal in caliber. No evidence of aneurysm or flow-limiting stenosis. Venous sinuses: Patent. Anatomic variants: None. Delayed phase: Negative. IMPRESSION: 1. Calcific plaque within the proximal internal carotid arteries bilaterally, with less than 20% stenosis on the right and no appreciable stenosis on the left. 2. Normal CT angiogram of the head. Electronically Signed   By: Evalene Coho M.D.   On: 07/27/2023 12:55   CT Angio Neck W and/or Wo Contrast Result Date: 07/27/2023 CLINICAL DATA:  dizziness, near syncope, vision changes EXAM: CT HEAD WITHOUT CONTRAST CT ANGIOGRAPHY OF THE HEAD AND NECK TECHNIQUE: Contiguous axial images were obtained from the base of the skull through the vertex without intravenous contrast. Multidetector CT imaging of the head and neck was performed using the standard protocol during bolus administration of intravenous contrast. Multiplanar CT image reconstructions and MIPs were obtained to evaluate the vascular anatomy. Carotid stenosis measurements (when applicable) are obtained utilizing NASCET criteria, using the distal internal carotid diameter as the denominator. RADIATION DOSE REDUCTION: This exam was performed according to  the departmental dose-optimization program which includes automated exposure control, adjustment of the mA and/or kV according to patient size and/or use of iterative reconstruction technique. CONTRAST:  75mL OMNIPAQUE  IOHEXOL  350 MG/ML SOLN  COMPARISON:  None Available. FINDINGS: CT HEAD Brain: Normal brain. No evidence of hemorrhage, mass, acute cortical infarct or hydrocephalus. Vascular: Atheromatous calcifications within the carotid siphons. Skull: Intact and unremarkable. Sinuses/Orbits: Clear paranasal sinuses.  Normal orbits. CTA NECK Aortic arch: Normal in caliber. Standard 3 vessel takeoff of the great arteries. Right carotid system: The common carotid artery is normal in caliber and unremarkable. There is mild to moderate calcific plaque within the origin of the internal carotid artery, with less than 20% stenosis. The internal carotid artery is mildly tortuous, but otherwise unremarkable. Left carotid system: The common carotid artery is normal in caliber and unremarkable. There is mild calcific atheromatous disease within the origin of the internal carotid artery, but no luminal stenosis. Vertebral arteries:Vertebral arteries are codominant and normal in caliber throughout the neck. Skeleton: Negative. Other neck: Small nodules are present within the right lobe and isthmus of the thyroid . The visualized lung apices are clear. CTA HEAD Anterior circulation: Mild to moderate calcific atheromatous disease within the right carotid siphon. No flow-limiting stenosis. The anterior and middle cerebral arteries and their proximal branches are normal in caliber. No evidence of aneurysm or flow-limiting stenosis. Posterior circulation: The vertebrobasilar system is unremarkable. The cerebellar and posterior cerebral arteries are normal in caliber. No evidence of aneurysm or flow-limiting stenosis. Venous sinuses: Patent. Anatomic variants: None. Delayed phase: Negative. IMPRESSION: 1. Calcific plaque within the proximal internal carotid arteries bilaterally, with less than 20% stenosis on the right and no appreciable stenosis on the left. 2. Normal CT angiogram of the head. Electronically Signed   By: Evalene Coho M.D.   On: 07/27/2023 12:55      Procedures   Medications Ordered in the ED  sodium chloride  0.9 % bolus 500 mL (500 mLs Intravenous New Bag/Given 07/27/23 1132)  iohexol  (OMNIPAQUE ) 350 MG/ML injection 75 mL (75 mLs Intravenous Contrast Given 07/27/23 1220)                                    Medical Decision Making Medical Decision Making Nursing notes are reviewed. Differential diagnosis for this patient would include but not limited to: Vertigo, CVA, carotid dissection, electro abnormality, other  Cardiac monitor interpretation: Sinus rhythm, no ectopy  Emergency Department Course:  Vital signs and pulse oximetry are reviewed, evaluated by myself and found to be within normal limits prior to final disposition. Findings of laboratory testing and medical imaging are discussed with patient and family that is available. Patient agrees with the medical care plan as follows:  Patient arrived for dizziness that happened yesterday.  She is still feeling somewhat dyspneic.  Vitals are stable and she had resolution of her symptoms while in the ER.  Results as below and reviewed by me without any acute abnormality on imaging or lab workup.  She was given IV fluids and Zofran  while in the ER.  I will give her prescription for meclizine .  Advise close up with primary care doctor as well as return to the ER for new or worsening symptoms.  She feels comfortable with plan to be discharged home.  Problems Addressed: Dizziness: acute illness or injury  Amount and/or Complexity of Data Reviewed External Data Reviewed: notes.    Details:  Outpatient workup reviewed and patient last seen in the office in May of this year for tinea pedis Labs: ordered. Decision-making details documented in ED Course.    Details: Ordered and reviewed by me and no acute abnormality, troponin is negative, CBC and BMP are within normal limits Radiology: ordered and independent interpretation performed. Decision-making details documented in ED  Course.    Details: Ordered and reviewed by me and show no evidence of significant stenosis or acute abnormality in the head or neck ECG/medicine tests: ordered and independent interpretation performed. Decision-making details documented in ED Course.    Details: Ordered and interpreted by me in the absence of cardiology and shows sinus rhythm, no STEMI no acute change when compared to prior EKG  Risk OTC drugs. Prescription drug management. Drug therapy requiring intensive monitoring for toxicity.    Final diagnoses:  Dizziness    ED Discharge Orders          Ordered    meclizine  (ANTIVERT ) 25 MG tablet  3 times daily PRN        07/27/23 1328               Aadya Kindler L, DO 07/27/23 1349    Brayah Urquilla L, DO 07/27/23 1349

## 2023-07-27 NOTE — ED Triage Notes (Signed)
 BIB family from [facility] for dizziness. EKG done and instructed to come to ED for further w/u. Sx onset last night after painting ceiling. Gradually progressively worse. Vomited x1 last night. Describes activity as lots of looking up and down repetitively. Denies pain, diarrhea, fever, sob, syncope or falls. Alert, NAD, calm, interactive, steady gait.

## 2023-07-28 ENCOUNTER — Other Ambulatory Visit (HOSPITAL_COMMUNITY): Payer: Self-pay

## 2023-07-28 MED ORDER — CONTOUR NEXT GEN MONITOR W/DEVICE KIT
PACK | 0 refills | Status: AC
Start: 2023-07-28 — End: ?
  Filled 2023-07-28: qty 1, 1d supply, fill #0

## 2023-07-28 MED ORDER — MICROLET LANCETS MISC
3 refills | Status: AC
  Filled 2023-07-28: qty 100, 100d supply, fill #0
  Filled 2023-08-18: qty 100, 90d supply, fill #0
  Filled 2023-11-24: qty 100, 90d supply, fill #1

## 2023-07-28 MED ORDER — GLUCOSE BLOOD VI STRP
ORAL_STRIP | 3 refills | Status: AC
  Filled 2023-07-28: qty 100, 100d supply, fill #0
  Filled 2023-11-24: qty 100, 100d supply, fill #1

## 2023-08-04 ENCOUNTER — Other Ambulatory Visit (HOSPITAL_COMMUNITY): Payer: Self-pay

## 2023-08-05 ENCOUNTER — Other Ambulatory Visit: Payer: Self-pay

## 2023-08-18 ENCOUNTER — Other Ambulatory Visit: Payer: Self-pay

## 2023-08-19 DIAGNOSIS — I10 Essential (primary) hypertension: Secondary | ICD-10-CM | POA: Diagnosis not present

## 2023-08-19 DIAGNOSIS — E119 Type 2 diabetes mellitus without complications: Secondary | ICD-10-CM | POA: Diagnosis not present

## 2023-08-19 DIAGNOSIS — J4520 Mild intermittent asthma, uncomplicated: Secondary | ICD-10-CM | POA: Diagnosis not present

## 2023-09-19 DIAGNOSIS — I10 Essential (primary) hypertension: Secondary | ICD-10-CM | POA: Diagnosis not present

## 2023-09-19 DIAGNOSIS — E119 Type 2 diabetes mellitus without complications: Secondary | ICD-10-CM | POA: Diagnosis not present

## 2023-09-19 DIAGNOSIS — J4520 Mild intermittent asthma, uncomplicated: Secondary | ICD-10-CM | POA: Diagnosis not present

## 2023-09-26 ENCOUNTER — Other Ambulatory Visit (HOSPITAL_COMMUNITY): Payer: Self-pay

## 2023-09-27 ENCOUNTER — Other Ambulatory Visit: Payer: Self-pay

## 2023-09-29 ENCOUNTER — Other Ambulatory Visit (HOSPITAL_COMMUNITY): Payer: Self-pay

## 2023-09-29 ENCOUNTER — Other Ambulatory Visit: Payer: Self-pay

## 2023-09-29 ENCOUNTER — Encounter: Payer: Self-pay | Admitting: Pharmacist

## 2023-09-30 ENCOUNTER — Other Ambulatory Visit (HOSPITAL_COMMUNITY): Payer: Self-pay

## 2023-10-01 ENCOUNTER — Other Ambulatory Visit (HOSPITAL_COMMUNITY): Payer: Self-pay

## 2023-10-01 MED ORDER — METFORMIN HCL ER 500 MG PO TB24
ORAL_TABLET | ORAL | 3 refills | Status: AC
  Filled 2023-10-01: qty 270, 90d supply, fill #0
  Filled 2024-01-11: qty 270, 90d supply, fill #1

## 2023-10-01 MED ORDER — LOSARTAN POTASSIUM 50 MG PO TABS
50.0000 mg | ORAL_TABLET | Freq: Every day | ORAL | 3 refills | Status: AC
  Filled 2023-10-01: qty 90, 90d supply, fill #0
  Filled 2024-01-11: qty 90, 90d supply, fill #1

## 2023-10-01 MED ORDER — ROSUVASTATIN CALCIUM 5 MG PO TABS
2.5000 mg | ORAL_TABLET | ORAL | 3 refills | Status: AC
  Filled 2023-10-01: qty 13, 91d supply, fill #0

## 2023-10-18 ENCOUNTER — Other Ambulatory Visit (HOSPITAL_BASED_OUTPATIENT_CLINIC_OR_DEPARTMENT_OTHER): Payer: Self-pay | Admitting: Family Medicine

## 2023-10-18 DIAGNOSIS — Z8249 Family history of ischemic heart disease and other diseases of the circulatory system: Secondary | ICD-10-CM

## 2023-10-18 DIAGNOSIS — Z23 Encounter for immunization: Secondary | ICD-10-CM | POA: Diagnosis not present

## 2023-10-18 DIAGNOSIS — E119 Type 2 diabetes mellitus without complications: Secondary | ICD-10-CM | POA: Diagnosis not present

## 2023-10-18 DIAGNOSIS — I10 Essential (primary) hypertension: Secondary | ICD-10-CM | POA: Diagnosis not present

## 2023-10-18 DIAGNOSIS — E7800 Pure hypercholesterolemia, unspecified: Secondary | ICD-10-CM | POA: Diagnosis not present

## 2023-11-10 ENCOUNTER — Other Ambulatory Visit (HOSPITAL_COMMUNITY): Payer: Self-pay

## 2023-11-10 MED ORDER — AMOXICILLIN 500 MG PO CAPS
2000.0000 mg | ORAL_CAPSULE | ORAL | 0 refills | Status: AC | PRN
  Filled 2023-11-10 (×2): qty 4, 1d supply, fill #0

## 2023-11-11 ENCOUNTER — Ambulatory Visit (HOSPITAL_BASED_OUTPATIENT_CLINIC_OR_DEPARTMENT_OTHER)
Admission: RE | Admit: 2023-11-11 | Discharge: 2023-11-11 | Disposition: A | Discharge status: Home / Self Care | Payer: Self-pay | Source: Ambulatory Visit | Attending: Family Medicine | Admitting: Family Medicine

## 2023-11-11 ENCOUNTER — Other Ambulatory Visit (HOSPITAL_BASED_OUTPATIENT_CLINIC_OR_DEPARTMENT_OTHER)

## 2023-11-11 DIAGNOSIS — E7800 Pure hypercholesterolemia, unspecified: Secondary | ICD-10-CM | POA: Insufficient documentation

## 2023-11-11 DIAGNOSIS — Z8249 Family history of ischemic heart disease and other diseases of the circulatory system: Secondary | ICD-10-CM | POA: Insufficient documentation

## 2023-11-24 ENCOUNTER — Other Ambulatory Visit (HOSPITAL_COMMUNITY): Payer: Self-pay

## 2023-11-24 ENCOUNTER — Other Ambulatory Visit: Payer: Self-pay

## 2023-11-24 MED ORDER — ROSUVASTATIN CALCIUM 5 MG PO TABS
5.0000 mg | ORAL_TABLET | ORAL | 0 refills | Status: DC
  Filled 2023-11-24: qty 26, 91d supply, fill #0

## 2024-01-11 ENCOUNTER — Other Ambulatory Visit (HOSPITAL_COMMUNITY): Payer: Self-pay

## 2024-02-08 ENCOUNTER — Other Ambulatory Visit: Payer: Self-pay

## 2024-02-08 ENCOUNTER — Other Ambulatory Visit (HOSPITAL_COMMUNITY): Payer: Self-pay

## 2024-02-08 MED ORDER — PRAVASTATIN SODIUM 20 MG PO TABS
20.0000 mg | ORAL_TABLET | Freq: Every day | ORAL | 0 refills | Status: AC
  Filled 2024-02-08: qty 90, 90d supply, fill #0

## 2024-05-30 ENCOUNTER — Ambulatory Visit: Admitting: Podiatry
